# Patient Record
Sex: Female | Born: 1965 | Race: White | Hispanic: No | Smoking: Former smoker
Health system: Southern US, Community
[De-identification: ages and names within clinical notes are randomized; demographics above are authoritative.]

## PROBLEM LIST (undated history)

## (undated) DIAGNOSIS — D66 Hereditary factor VIII deficiency: Secondary | ICD-10-CM

## (undated) DIAGNOSIS — F32A Depression, unspecified: Secondary | ICD-10-CM

## (undated) DIAGNOSIS — F1010 Alcohol abuse, uncomplicated: Secondary | ICD-10-CM

## (undated) DIAGNOSIS — F329 Major depressive disorder, single episode, unspecified: Secondary | ICD-10-CM

---
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
## 1998-03-01 ENCOUNTER — Ambulatory Visit (HOSPITAL_COMMUNITY): Admission: RE | Admit: 1998-03-01 | Discharge: 1998-03-01 | Payer: Self-pay | Admitting: Family Medicine

## 1998-07-22 ENCOUNTER — Encounter: Payer: Self-pay | Admitting: Emergency Medicine

## 1998-07-22 ENCOUNTER — Emergency Department (HOSPITAL_COMMUNITY): Admission: EM | Admit: 1998-07-22 | Discharge: 1998-07-22 | Payer: Self-pay | Admitting: Emergency Medicine

## 1999-04-11 ENCOUNTER — Other Ambulatory Visit: Admission: RE | Admit: 1999-04-11 | Discharge: 1999-04-11 | Payer: Self-pay | Admitting: *Deleted

## 2000-09-03 ENCOUNTER — Other Ambulatory Visit: Admission: RE | Admit: 2000-09-03 | Discharge: 2000-09-03 | Payer: Self-pay | Admitting: *Deleted

## 2002-01-19 ENCOUNTER — Encounter: Admission: RE | Admit: 2002-01-19 | Discharge: 2002-04-19 | Payer: Self-pay | Admitting: Family Medicine

## 2003-08-13 ENCOUNTER — Emergency Department (HOSPITAL_COMMUNITY): Admission: EM | Admit: 2003-08-13 | Discharge: 2003-08-13 | Payer: Self-pay | Admitting: Emergency Medicine

## 2003-12-19 ENCOUNTER — Emergency Department (HOSPITAL_COMMUNITY): Admission: EM | Admit: 2003-12-19 | Discharge: 2003-12-19 | Payer: Self-pay | Admitting: Emergency Medicine

## 2004-01-11 ENCOUNTER — Other Ambulatory Visit: Admission: RE | Admit: 2004-01-11 | Discharge: 2004-01-11 | Payer: Self-pay | Admitting: Gynecology

## 2005-12-28 ENCOUNTER — Emergency Department (HOSPITAL_COMMUNITY): Admission: EM | Admit: 2005-12-28 | Discharge: 2005-12-28 | Payer: Self-pay | Admitting: Emergency Medicine

## 2008-01-29 ENCOUNTER — Emergency Department (HOSPITAL_COMMUNITY): Admission: EM | Admit: 2008-01-29 | Discharge: 2008-01-30 | Payer: Self-pay | Admitting: Emergency Medicine

## 2008-02-25 ENCOUNTER — Other Ambulatory Visit (HOSPITAL_COMMUNITY): Admission: RE | Admit: 2008-02-25 | Discharge: 2008-04-19 | Payer: Self-pay | Admitting: Psychiatry

## 2008-02-26 ENCOUNTER — Ambulatory Visit: Payer: Self-pay | Admitting: Psychiatry

## 2010-07-10 LAB — URINE DRUGS OF ABUSE SCREEN W ALC, ROUTINE (REF LAB)
Barbiturate Quant, Ur: NEGATIVE
Barbiturate Quant, Ur: NEGATIVE
Barbiturate Quant, Ur: NEGATIVE
Benzodiazepines.: NEGATIVE
Benzodiazepines.: NEGATIVE
Benzodiazepines.: NEGATIVE
Ethyl Alcohol: <10 mg/dL (ref <10)
Ethyl Alcohol: <10 mg/dL (ref <10)
Ethyl Alcohol: <10 mg/dL (ref <10)
Marijuana Metabolite: NEGATIVE
Marijuana Metabolite: NEGATIVE
Marijuana Metabolite: NEGATIVE
Methadone: NEGATIVE
Phencyclidine (PCP): NEGATIVE
Phencyclidine (PCP): NEGATIVE
Phencyclidine (PCP): NEGATIVE
Propoxyphene: NEGATIVE
Propoxyphene: NEGATIVE
Propoxyphene: NEGATIVE

## 2010-12-26 LAB — POCT I-STAT, CHEM 8
BUN: <3 — ABNORMAL LOW
Calcium, Ion: 1.13
Creatinine, Ser: 0.8
Hemoglobin: 15.3 — ABNORMAL HIGH
TCO2: 24

## 2010-12-26 LAB — DIFFERENTIAL
Basophils Absolute: 0
Basophils Relative: 0
Eosinophils Absolute: 0
Eosinophils Relative: 0
Monocytes Absolute: 0.3
Monocytes Relative: 5
Neutro Abs: 3.5

## 2010-12-26 LAB — URINALYSIS, ROUTINE W REFLEX MICROSCOPIC
Bilirubin Urine: NEGATIVE
Hgb urine dipstick: NEGATIVE
Ketones, ur: NEGATIVE
Nitrite: NEGATIVE
Specific Gravity, Urine: 1.007
Urobilinogen, UA: 0.2

## 2010-12-26 LAB — RAPID URINE DRUG SCREEN, HOSP PERFORMED
Amphetamines: NOT DETECTED
Barbiturates: NOT DETECTED
Benzodiazepines: NOT DETECTED
Tetrahydrocannabinol: NOT DETECTED

## 2010-12-26 LAB — CBC
HCT: 43.6
Hemoglobin: 14.8
MCHC: 34
MCV: 102.5 — ABNORMAL HIGH
RBC: 4.26
RDW: 12.8

## 2010-12-26 LAB — SALICYLATE LEVEL: Salicylate Lvl: <4

## 2010-12-29 LAB — URINE DRUGS OF ABUSE SCREEN W ALC, ROUTINE (REF LAB)
Amphetamine Screen, Ur: NEGATIVE
Benzodiazepines.: NEGATIVE
Benzodiazepines.: NEGATIVE
Benzodiazepines.: NEGATIVE
Cocaine Metabolites: NEGATIVE
Cocaine Metabolites: NEGATIVE
Cocaine Metabolites: NEGATIVE
Cocaine Metabolites: NEGATIVE
Creatinine,U: 16.1 mg/dL
Methadone: NEGATIVE
Methadone: NEGATIVE
Methadone: NEGATIVE
Methadone: NEGATIVE
Opiate Screen, Urine: NEGATIVE
Phencyclidine (PCP): NEGATIVE
Phencyclidine (PCP): NEGATIVE
Phencyclidine (PCP): NEGATIVE
Phencyclidine (PCP): NEGATIVE
Propoxyphene: NEGATIVE
Propoxyphene: NEGATIVE
Propoxyphene: NEGATIVE
Propoxyphene: NEGATIVE

## 2017-04-20 ENCOUNTER — Emergency Department (HOSPITAL_COMMUNITY)
Admission: EM | Admit: 2017-04-20 | Discharge: 2017-04-20 | Disposition: A | Discharge status: Home / Self Care | Payer: Self-pay | Attending: Emergency Medicine | Admitting: Emergency Medicine

## 2017-04-20 ENCOUNTER — Encounter (HOSPITAL_COMMUNITY): Payer: Self-pay | Admitting: Emergency Medicine

## 2017-04-20 DIAGNOSIS — D68311 Acquired hemophilia: Secondary | ICD-10-CM

## 2017-04-20 DIAGNOSIS — Y929 Unspecified place or not applicable: Secondary | ICD-10-CM

## 2017-04-20 DIAGNOSIS — Y939 Activity, unspecified: Secondary | ICD-10-CM

## 2017-04-20 DIAGNOSIS — W01198A Fall on same level from slipping, tripping and stumbling with subsequent striking against other object, initial encounter: Secondary | ICD-10-CM

## 2017-04-20 DIAGNOSIS — Y999 Unspecified external cause status: Secondary | ICD-10-CM

## 2017-04-20 DIAGNOSIS — S0181XA Laceration without foreign body of other part of head, initial encounter: Principal | ICD-10-CM

## 2017-04-20 HISTORY — DX: Hereditary factor VIII deficiency: D66

## 2017-04-20 MED ORDER — LIDOCAINE HCL (PF) 1 % IJ SOLN
INTRAMUSCULAR | Status: AC
  Filled 2017-04-20: qty 5

## 2017-04-20 NOTE — ED Provider Notes (Signed)
[facility]   CSN: [id number] Arrival date & time: 04/20/17  0157     History   Chief Complaint Chief Complaint  Patient presents with  . Fall  . Alcohol Intoxication    HPI Terri Maxwell is a 52 y.o. female.  Patient is a 52 year old female with past medical history of hemophilia presenting for evaluation of a head injury.  She reports falling at home this evening while intoxicated.  She struck her head on the floor and caused a laceration to the left forehead.  I am told there may have been a loss of consciousness, however she denies this.  She describes pain in the area of the laceration, but denies headache.  She denies any neck pain or other injury.   The history is provided by the patient.  Head Laceration  This is a new problem. The current episode started less than 1 hour ago. The problem occurs constantly. The problem has not changed since onset.Pertinent negatives include no headaches. Nothing aggravates the symptoms. Nothing relieves the symptoms. She has tried nothing for the symptoms.    Past Medical History:  Diagnosis Date  . Hemophilia (HCC)     There are no active problems to display for this patient.   History reviewed. No pertinent surgical history.  OB History    No data available       Home Medications    Prior to Admission medications   Not on File    Family History No family history on file.  Social History Social History   Tobacco Use  . Smoking status: Not on file  Substance Use Topics  . Alcohol use: Not on file  . Drug use: Not on file     Allergies   Codeine; Keflex [cephalexin]; Penicillins; and Plavix [clopidogrel bisulfate]   Review of Systems Review of Systems  Neurological: Negative for headaches.  All other systems reviewed and are negative.    Physical Exam Updated Vital Signs There were no vitals taken for this visit.  Physical Exam    Constitutional: She is oriented to person, place, and time. She appears well-developed and well-nourished. No distress.  HENT:  Head: Normocephalic.  There is a 3.5 cm laceration to the left forehead near the hairline.  Eyes: EOM are normal. Pupils are equal, round, and reactive to light.  Neck: Normal range of motion. Neck supple.  Pulmonary/Chest: Effort normal.  Neurological: She is alert and oriented to person, place, and time. No cranial nerve deficit. She exhibits normal muscle tone. Coordination normal.  Skin: She is not diaphoretic.  Nursing note and vitals reviewed.    ED Treatments / Results  Labs (all labs ordered are listed, but only abnormal results are displayed) Labs Reviewed - No data to display  EKG  EKG Interpretation None       Radiology No results found.  Procedures Procedures (including critical care time)  Medications Ordered in ED Medications  lidocaine (PF) (XYLOCAINE) 1 % injection (not administered)     Initial Impression / Assessment and Plan / ED Course  I have reviewed the triage vital signs and the nursing notes.  Pertinent labs & imaging results that were available during my care of the patient were reviewed by me and considered in my medical decision making (see chart for details).  Laceration was repaired as described below.  I strongly urged the patient to undergo a CT scan, however she adamantly refused.  She understands that her  history of "asymptomatic hemophilia" could potentially predispose her to potentially life-threatening bleeding and is still requesting to forego this test.  She will be discharged with local wound care and suture removal in 5-7 days.  LACERATION REPAIR Performed by: Geoffery Lyons Authorized by: Geoffery Lyons Consent: Verbal consent obtained. Risks and benefits: risks, benefits and alternatives were discussed Consent given by: patient Patient identity confirmed: provided demographic data Prepped and Draped  in normal sterile fashion Wound explored  Laceration Location: Left forehead  Laceration Length: 2.5 cm  No Foreign Bodies seen or palpated  Anesthesia: local infiltration  Local anesthetic: lidocaine 1 % with epinephrine  Anesthetic total: 3 ml  Irrigation method: syringe Amount of cleaning: standard  Skin closure: 5-0 Ethilon  Number of sutures: 6  Technique: Simple interrupted  Patient tolerance: Patient tolerated the procedure well with no immediate complications.   Final Clinical Impressions(s) / ED Diagnoses   Final diagnoses:  None    ED Discharge Orders    None       Geoffery Lyons, MD 04/20/17 [phone]

## 2017-04-20 NOTE — ED Notes (Signed)
Pt's significant other took pt home.  Wheelchair to front door and pt placed into his car. Asked pt if she felt she could go home and reiterated for her to tell us if she did not feel safe. Pt states she feels safe and wants to go with her boyfriend home.

## 2017-04-20 NOTE — ED Notes (Signed)
Pt declined vital signs during her entire stay in the emergency department.
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
## 2017-04-20 NOTE — Discharge Instructions (Signed)
Local wound care with bacitracin and dressing changes twice daily.  Sutures are to be removed in 5-7 days.  Please follow-up with your primary doctor for this.  Return to the emergency department in the meantime if you develop redness, pus draining from the wound, fevers, or other new and concerning symptoms.
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
## 2019-01-12 ENCOUNTER — Other Ambulatory Visit: Payer: Self-pay

## 2019-01-12 ENCOUNTER — Emergency Department (HOSPITAL_COMMUNITY): Payer: Self-pay

## 2019-01-12 ENCOUNTER — Emergency Department (HOSPITAL_COMMUNITY)
Admission: EM | Admit: 2019-01-12 | Discharge: 2019-01-12 | Discharge status: Against Medical Advice | Payer: Self-pay | Attending: Emergency Medicine | Admitting: Emergency Medicine

## 2019-01-12 ENCOUNTER — Encounter (HOSPITAL_COMMUNITY): Payer: Self-pay

## 2019-01-12 DIAGNOSIS — Z5320 Procedure and treatment not carried out because of patient's decision for unspecified reasons: Secondary | ICD-10-CM | POA: Insufficient documentation

## 2019-01-12 DIAGNOSIS — R4182 Altered mental status, unspecified: Secondary | ICD-10-CM | POA: Insufficient documentation

## 2019-01-12 HISTORY — DX: Depression, unspecified: F32.A

## 2019-01-12 LAB — COMPREHENSIVE METABOLIC PANEL
ALT: 84 U/L — ABNORMAL HIGH (ref 0–44)
AST: 91 U/L — ABNORMAL HIGH (ref 15–41)
Albumin: 4.5 g/dL (ref 3.5–5.0)
Alkaline Phosphatase: 81 U/L (ref 38–126)
Anion gap: 12 (ref 5–15)
BUN: 12 mg/dL (ref 6–20)
CO2: 23 mmol/L (ref 22–32)
Calcium: 9.2 mg/dL (ref 8.9–10.3)
Chloride: 104 mmol/L (ref 98–111)
Creatinine, Ser: 0.69 mg/dL (ref 0.44–1.00)
GFR calc Af Amer: >60 mL/min (ref >60)
GFR calc non Af Amer: >60 mL/min (ref >60)
Glucose, Bld: 107 mg/dL — ABNORMAL HIGH (ref 70–99)
Potassium: 3.9 mmol/L (ref 3.5–5.1)
Sodium: 139 mmol/L (ref 135–145)
Total Bilirubin: 1 mg/dL (ref 0.3–1.2)
Total Protein: 7.7 g/dL (ref 6.5–8.1)

## 2019-01-12 LAB — URINALYSIS, ROUTINE W REFLEX MICROSCOPIC
Bilirubin Urine: NEGATIVE
Glucose, UA: NEGATIVE mg/dL
Hgb urine dipstick: NEGATIVE
Ketones, ur: NEGATIVE mg/dL
Leukocytes,Ua: NEGATIVE
Nitrite: NEGATIVE
Protein, ur: NEGATIVE mg/dL
Specific Gravity, Urine: 1.015 (ref 1.005–1.030)
pH: 8 (ref 5.0–8.0)

## 2019-01-12 LAB — CBC WITH DIFFERENTIAL/PLATELET
Abs Immature Granulocytes: 0.04 10*3/uL (ref 0.00–0.07)
Basophils Absolute: 0.1 10*3/uL (ref 0.0–0.1)
Basophils Relative: 1 %
Eosinophils Absolute: 0 10*3/uL (ref 0.0–0.5)
Eosinophils Relative: 0 %
HCT: 42.6 % (ref 36.0–46.0)
Hemoglobin: 14.3 g/dL (ref 12.0–15.0)
Immature Granulocytes: 0 %
Lymphocytes Relative: 15 %
Lymphs Abs: 1.4 10*3/uL (ref 0.7–4.0)
MCH: 36.5 pg — ABNORMAL HIGH (ref 26.0–34.0)
MCHC: 33.6 g/dL (ref 30.0–36.0)
MCV: 108.7 fL — ABNORMAL HIGH (ref 80.0–100.0)
Monocytes Absolute: 0.9 10*3/uL (ref 0.1–1.0)
Monocytes Relative: 9 %
Neutro Abs: 6.8 10*3/uL (ref 1.7–7.7)
Neutrophils Relative %: 75 %
Platelets: 188 10*3/uL (ref 150–400)
RBC: 3.92 MIL/uL (ref 3.87–5.11)
RDW: 12.8 % (ref 11.5–15.5)
WBC: 9.3 10*3/uL (ref 4.0–10.5)
nRBC: 0 % (ref 0.0–0.2)

## 2019-01-12 LAB — ETHANOL: Alcohol, Ethyl (B): <10 mg/dL (ref <10)

## 2019-01-12 LAB — AMMONIA: Ammonia: 61 umol/L — ABNORMAL HIGH (ref 9–35)

## 2019-01-12 LAB — RAPID URINE DRUG SCREEN, HOSP PERFORMED
Amphetamines: NOT DETECTED
Barbiturates: NOT DETECTED
Benzodiazepines: NOT DETECTED
Cocaine: NOT DETECTED
Opiates: NOT DETECTED
Tetrahydrocannabinol: NOT DETECTED

## 2019-01-12 LAB — ACETAMINOPHEN LEVEL: Acetaminophen (Tylenol), Serum: <10 ug/mL — ABNORMAL LOW (ref 10–30)

## 2019-01-12 LAB — CBG MONITORING, ED: Glucose-Capillary: 106 mg/dL — ABNORMAL HIGH (ref 70–99)

## 2019-01-12 LAB — SALICYLATE LEVEL: Salicylate Lvl: <7 mg/dL (ref 2.8–30.0)
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
## 2019-01-12 NOTE — ED Notes (Signed)
Patient transported to CT 

## 2019-01-12 NOTE — ED Notes (Signed)
Pt got up out of the bed because she had to use the bathroom.  Walked her to the bathroom and gave her a specimen cup to urinate in.  Specimen cup is no where to be seen and pt urinated on the floor of the bathroom.

## 2019-01-12 NOTE — ED Triage Notes (Signed)
Pt BIB EMS from home. Pt daughter reports pt is disoriented and drowsy. Pt daughter reports this is not baseline. Pt denies taking any meds not prescribed. Pt prescribed Ambien and daughter reports full bottle of 30 tablets 4 days ago and now bottle is empty.   220/110 BP 111 HR  CBG 126  20G L hand

## 2019-01-12 NOTE — ED Notes (Signed)
ED Provider at bedside. 

## 2019-01-12 NOTE — ED Notes (Signed)
RN informed by Bonnita Nasuti, EMT that patient and patient daughter left. Pt has eloped. Dr. Melina Copa aware.

## 2020-05-18 IMAGING — CT CT HEAD W/O CM
3 series · 16 of 47 positions shown, 19 images · non-contrast
Comparison: None.

CLINICAL DATA: Altered LOC

EXAM:
CT HEAD WITHOUT CONTRAST
TECHNIQUE: Contiguous axial images were obtained from the base of the skull
through the vertex without intravenous contrast.

[Series 3: head wo · axial · 0.45mm/px · z∈[-188,-53]mm · 10 of 33 slices shown, 13 images]
[im 3/33  brain]
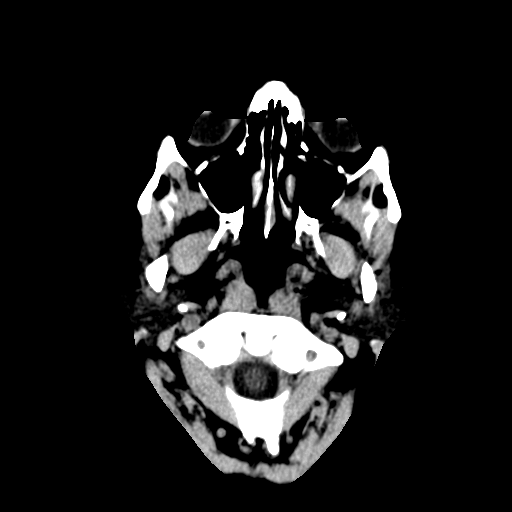
[im 3/33  bone]
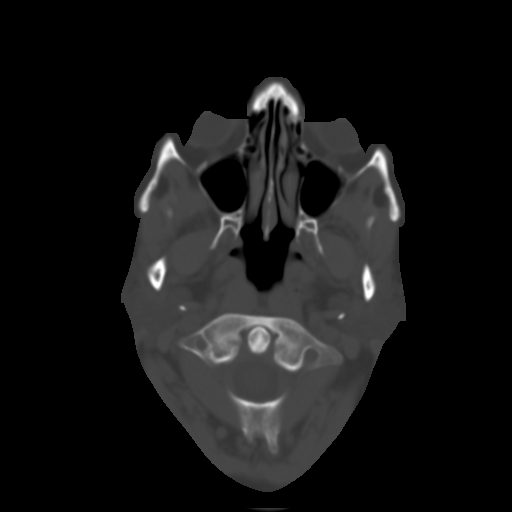
[im 6/33  brain]
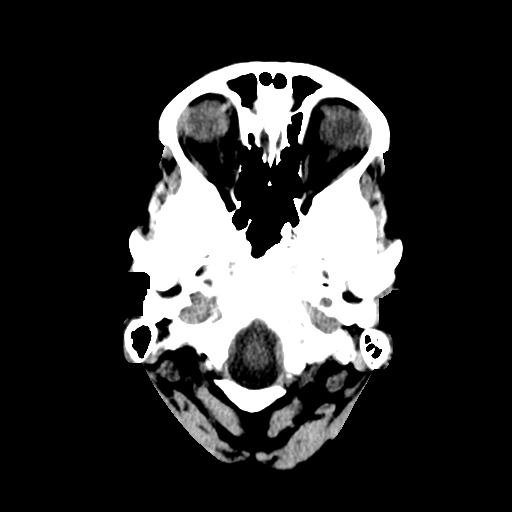
[im 9/33  brain]
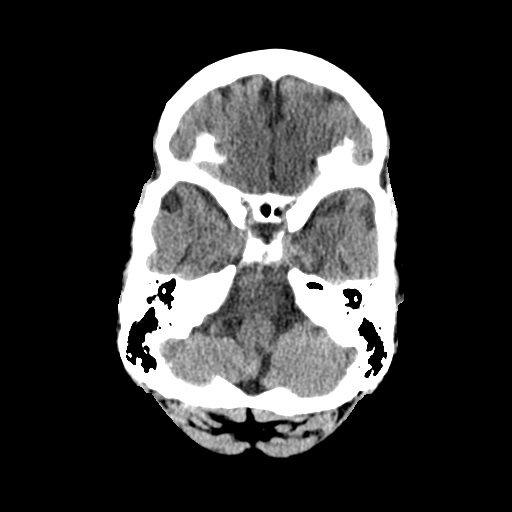
[im 12/33  brain]
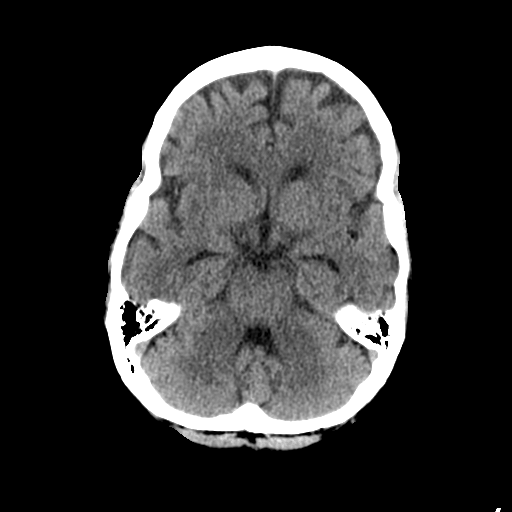
[im 15/33  brain]
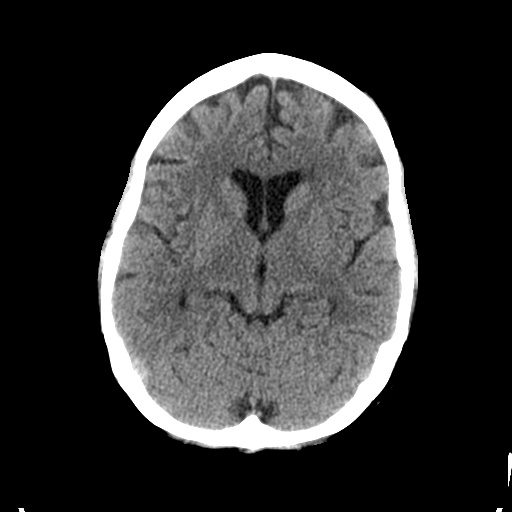
[im 15/33  bone]
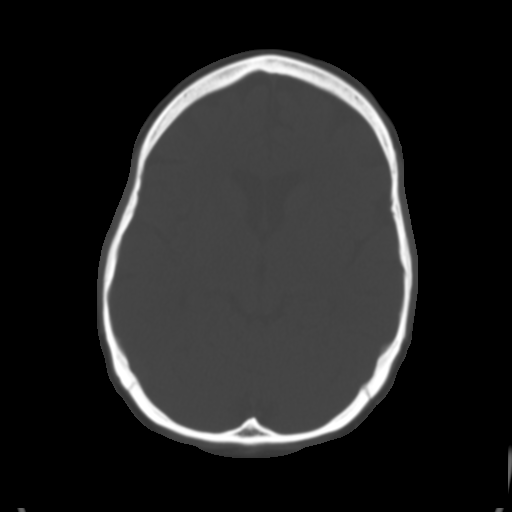
[im 18/33  brain]
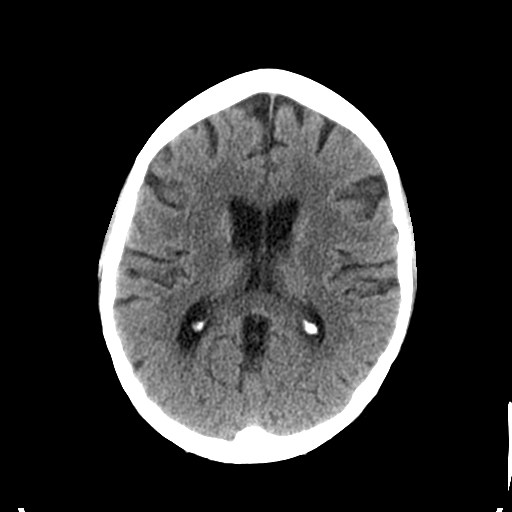
[im 21/33  brain]
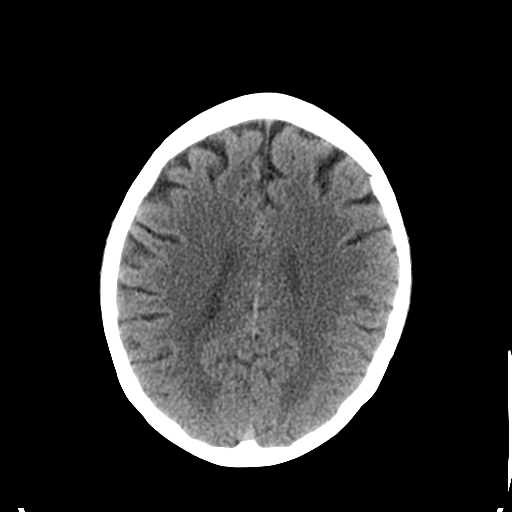
[im 25/33  brain]
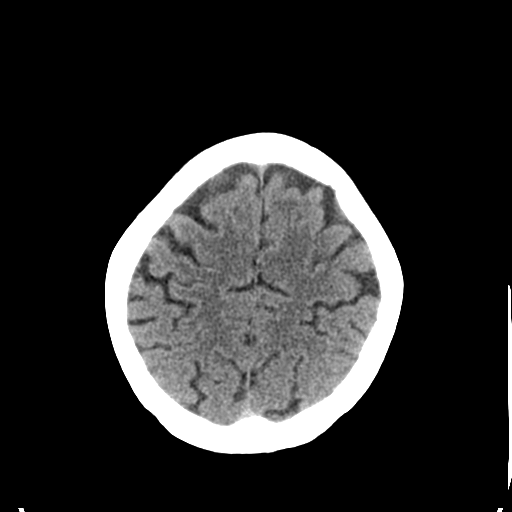
[im 27/33  brain]
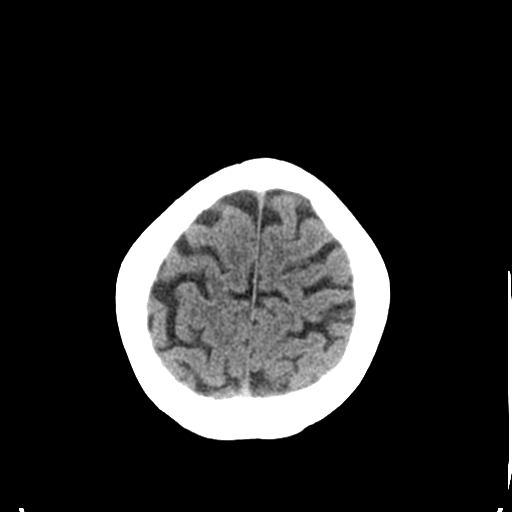
[im 27/33  bone]
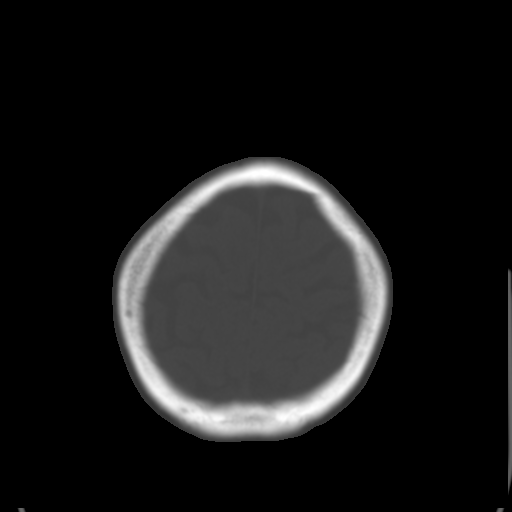
[im 30/33  brain]
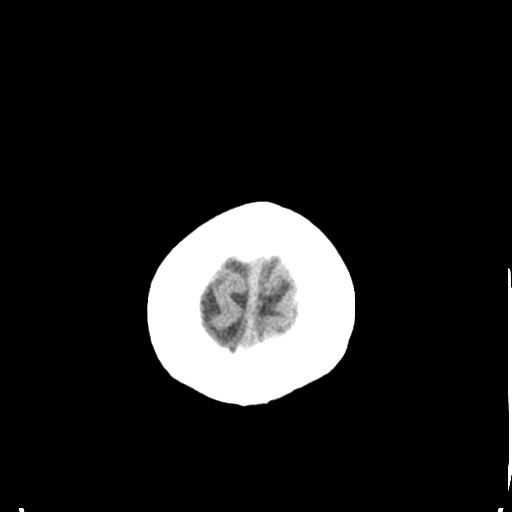

[Series 5: coronal soft tissue · coronal · 0.34mm/px · 3 of 83 slices shown]
[im 28/83  brain]
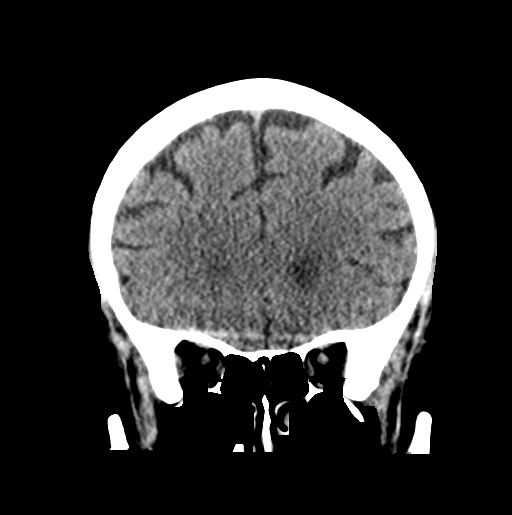
[im 37/83  brain]
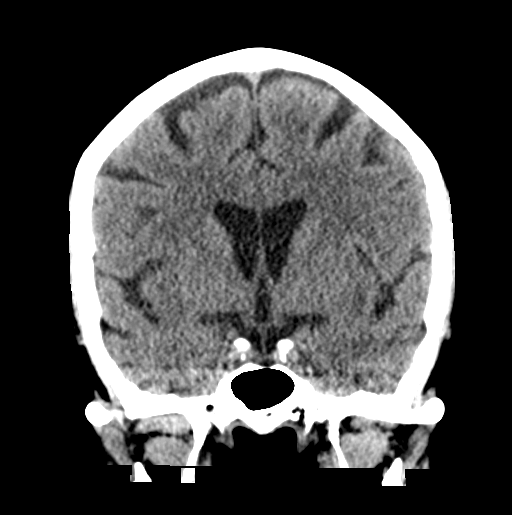
[im 46/83  brain]
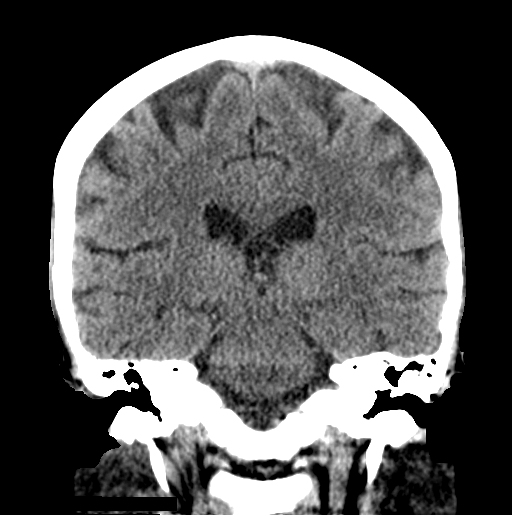

[Series 6: sagittal soft tissue · sagittal · 0.36mm/px · 3 of 55 slices shown]
[im 19/55  brain]
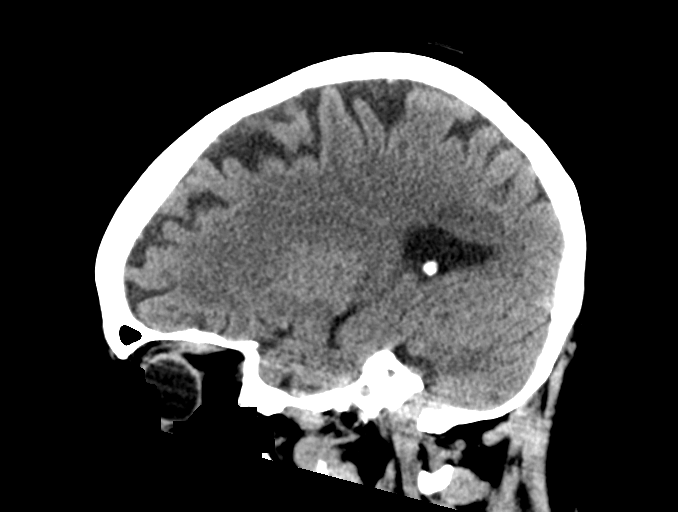
[im 28/55  brain]
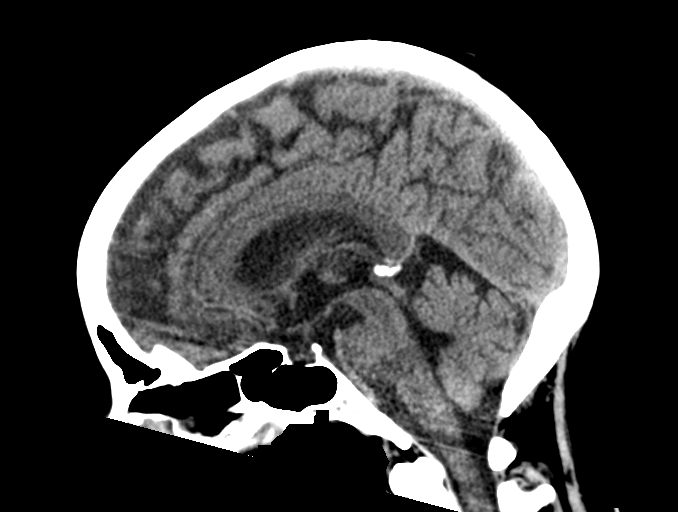
[im 37/55  brain]
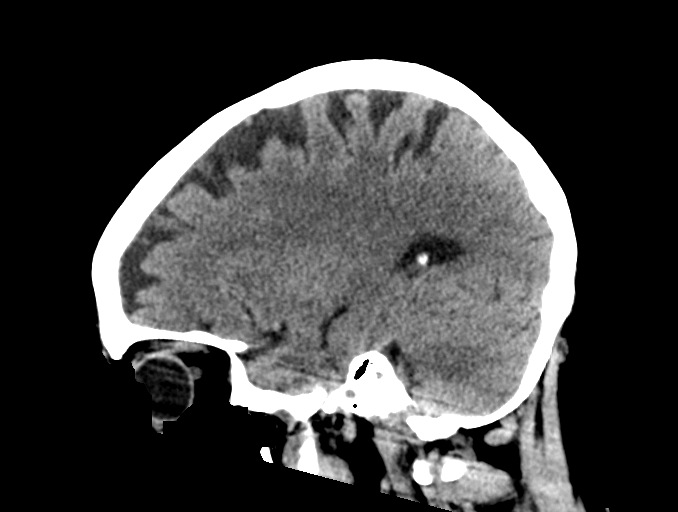

[16 of 47 positions shown; findings below may reference images not displayed]

FINDINGS: Brain: No evidence of acute infarction, hemorrhage, hydrocephalus,
extra-axial collection or mass lesion/mass effect.

Vascular: No hyperdense vessel or unexpected calcification.

Skull: Normal. Negative for fracture or focal lesion.

Sinuses/Orbits: No acute finding.

Other: None
IMPRESSION: Negative non contrasted CT appearance of the brain

## 2020-11-23 ENCOUNTER — Emergency Department (HOSPITAL_COMMUNITY)
Admission: EM | Admit: 2020-11-23 | Discharge: 2020-11-23 | Disposition: A | Discharge status: Home / Self Care | Payer: BC Managed Care – PPO | Attending: Emergency Medicine | Admitting: Emergency Medicine

## 2020-11-23 ENCOUNTER — Encounter (HOSPITAL_COMMUNITY): Payer: Self-pay | Admitting: *Deleted

## 2020-11-23 ENCOUNTER — Other Ambulatory Visit: Payer: Self-pay

## 2020-11-23 DIAGNOSIS — F1290 Cannabis use, unspecified, uncomplicated: Secondary | ICD-10-CM | POA: Insufficient documentation

## 2020-11-23 DIAGNOSIS — R112 Nausea with vomiting, unspecified: Secondary | ICD-10-CM | POA: Insufficient documentation

## 2020-11-23 DIAGNOSIS — R509 Fever, unspecified: Secondary | ICD-10-CM | POA: Diagnosis not present

## 2020-11-23 DIAGNOSIS — Z79899 Other long term (current) drug therapy: Secondary | ICD-10-CM | POA: Insufficient documentation

## 2020-11-23 DIAGNOSIS — R000 Tachycardia, unspecified: Secondary | ICD-10-CM | POA: Diagnosis not present

## 2020-11-23 DIAGNOSIS — R197 Diarrhea, unspecified: Secondary | ICD-10-CM | POA: Insufficient documentation

## 2020-11-23 LAB — COMPREHENSIVE METABOLIC PANEL
ALT: 41 U/L (ref 0–44)
AST: 46 U/L — ABNORMAL HIGH (ref 15–41)
Albumin: 5.2 g/dL — ABNORMAL HIGH (ref 3.5–5.0)
Alkaline Phosphatase: 121 U/L (ref 38–126)
Anion gap: 18 — ABNORMAL HIGH (ref 5–15)
BUN: 18 mg/dL (ref 6–20)
CO2: 20 mmol/L — ABNORMAL LOW (ref 22–32)
Calcium: 11 mg/dL — ABNORMAL HIGH (ref 8.9–10.3)
Chloride: 99 mmol/L (ref 98–111)
Creatinine, Ser: 0.8 mg/dL (ref 0.44–1.00)
GFR, Estimated: >60 mL/min (ref >60)
Glucose, Bld: 145 mg/dL — ABNORMAL HIGH (ref 70–99)
Potassium: 3.8 mmol/L (ref 3.5–5.1)
Sodium: 137 mmol/L (ref 135–145)
Total Bilirubin: 1.8 mg/dL — ABNORMAL HIGH (ref 0.3–1.2)
Total Protein: 9.4 g/dL — ABNORMAL HIGH (ref 6.5–8.1)

## 2020-11-23 LAB — URINALYSIS, ROUTINE W REFLEX MICROSCOPIC
Glucose, UA: NEGATIVE mg/dL
Hgb urine dipstick: NEGATIVE
Ketones, ur: 80 mg/dL — AB
Leukocytes,Ua: NEGATIVE
Nitrite: NEGATIVE
Protein, ur: 30 mg/dL — AB
Specific Gravity, Urine: 1.025 (ref 1.005–1.030)
pH: 6 (ref 5.0–8.0)

## 2020-11-23 LAB — RAPID URINE DRUG SCREEN, HOSP PERFORMED
Amphetamines: POSITIVE — AB
Barbiturates: NOT DETECTED
Benzodiazepines: NOT DETECTED
Cocaine: NOT DETECTED
Opiates: NOT DETECTED
Tetrahydrocannabinol: POSITIVE — AB

## 2020-11-23 LAB — CBC
HCT: 52.4 % — ABNORMAL HIGH (ref 36.0–46.0)
Hemoglobin: 18.3 g/dL — ABNORMAL HIGH (ref 12.0–15.0)
MCH: 36.8 pg — ABNORMAL HIGH (ref 26.0–34.0)
MCHC: 34.9 g/dL (ref 30.0–36.0)
MCV: 105.4 fL — ABNORMAL HIGH (ref 80.0–100.0)
Platelets: 286 10*3/uL (ref 150–400)
RBC: 4.97 MIL/uL (ref 3.87–5.11)
RDW: 12.4 % (ref 11.5–15.5)
WBC: 12 10*3/uL — ABNORMAL HIGH (ref 4.0–10.5)
nRBC: 0 % (ref 0.0–0.2)

## 2020-11-23 LAB — I-STAT BETA HCG BLOOD, ED (MC, WL, AP ONLY): I-stat hCG, quantitative: <5 m[IU]/mL (ref <5)

## 2020-11-23 LAB — LIPASE, BLOOD: Lipase: 50 U/L (ref 11–51)

## 2020-11-23 MED ORDER — PROMETHAZINE HCL 25 MG RE SUPP: 25.0000 mg | Freq: Four times a day (QID) | RECTAL | 0 refills | Status: DC | PRN

## 2020-11-23 MED ORDER — SODIUM CHLORIDE 0.9 % IV SOLN
12.5000 mg | Freq: Four times a day (QID) | INTRAVENOUS | Status: DC | PRN
  Administered 2020-11-23: 12.5 mg via INTRAVENOUS
  Filled 2020-11-23: qty 12.5

## 2020-11-23 MED ORDER — SODIUM CHLORIDE 0.9 % IV BOLUS
1000.0000 mL | Freq: Once | INTRAVENOUS | Status: AC
  Administered 2020-11-23: 1000 mL via INTRAVENOUS

## 2020-11-23 NOTE — ED Provider Notes (Signed)
Emergency Medicine Provider Triage Evaluation Note  Terri Maxwell , a 55 y.o. female  was evaluated in triage.  Pt complains of nausea, vomiting for the past 3 days.  Reports initially had abdominal pain however this has now resolved.  Has unable to keep anything down.  Previous episodes of gastroenteritis similar to these, where she had to receive Phenergan suppositories.  She is without any abdominal pain or chest pain on today's visit.  Review of Systems  Positive: Nausea, vomiting Negative: Fever, abdominal, urinary symptoms, chest pain  Physical Exam  BP (!) 176/128 (BP Location: Left Arm)   Pulse (!) 132   Temp 97.8 F (36.6 C) (Axillary) Comment: ice in mouth  Resp 20   SpO2 98%  Gen:   Awake, no distress   Resp:  Normal effort  MSK:   Moves extremities without difficulty  Other:    Medical Decision Making  Medically screening exam initiated at 10:42 AM.  Appropriate orders placed.  [name] was informed that the remainder of the evaluation will be completed by another provider, this initial triage assessment does not replace that evaluation, and the importance of remaining in the ED until their evaluation is complete.  Patient here with nausea and vomiting similar episodes in the past consistent with gastroenteritis per patient.  Has not taken anything for pain.  Heart rate remarkable for tachycardia.  She is without any abdominal pain or chest pain.  Afebrile in triage.   Claude Manges, PA-C 11/23/20 1044    Ernie Avena, MD 11/23/20 1115

## 2020-11-23 NOTE — ED Triage Notes (Signed)
Pt complains of fever, emesis x 3 days. Initially had abd pain and diarrhea, which has since resolved.

## 2020-11-23 NOTE — Discharge Instructions (Addendum)
You were provided with a prescription for phenergan to help with your symptoms. Please take this medication as prescribed.   If you experience any worsening symptoms please return to the ED for reevaluation.

## 2020-11-23 NOTE — ED Notes (Signed)
Informed the nurse of b/p

## 2020-11-23 NOTE — ED Provider Notes (Signed)
[facility]-EMERGENCY DEPT Provider Note   CSN: [id number] Arrival date & time: 11/23/20  1023     History Chief Complaint  Patient presents with   Emesis   Fever    Terri Maxwell is a 55 y.o. female.  54 y.o female with a PMH of Depression, presents to the ED with a chief complaint of nausea, vomiting which began 3 days ago.  Patient reports she had a previous episode of "GI bug ", this has occurred in the past.  States he has been unable to keep anything down, has tried ice chips, water without any improvement in her symptoms.  She reports approximately 15 episodes of vomiting in the past 3 days daily.  Also reports a subjective fever that improved after Tuesday morning.  Is currently employed at [employer], reports no other sick exposures.  States multiple episodes of bilious emesis.  No hematemesis.  Prior history of alcohol use in the past.  Denies any abdominal pain, chest pain, urinary symptoms, other complaints.   The history is provided by the patient and medical records.  Emesis Severity:  Moderate Duration:  3 days Timing:  Constant Number of daily episodes:  15 Quality:  Bilious material Progression:  Worsening Chronicity:  Recurrent Recent urination:  Normal Relieved by:  Nothing Worsened by:  Nothing Ineffective treatments:  Antiemetics Associated symptoms: diarrhea and fever   Associated symptoms: no abdominal pain   Fever Associated symptoms: diarrhea and vomiting   Associated symptoms: no chest pain       Past Medical History:  Diagnosis Date   Depression    Hemophilia (HCC)     There are no problems to display for this patient.   History reviewed. No pertinent surgical history.   OB History   No obstetric history on file.     No family history on file.     Home Medications Prior to Admission medications   Medication Sig Start Date End Date Taking? Authorizing Provider  Biotin 10 MG TABS Take 1 tablet by mouth  daily.   Yes [provider]  Multiple Vitamins-Minerals (MULTIVITAMIN ADULTS PO) Take 1 tablet by mouth daily.   Yes [provider]  promethazine (PHENERGAN) 25 MG suppository Place 1 suppository (25 mg total) rectally every 6 (six) hours as needed for up to 5 days for nausea or vomiting. 11/23/20 11/28/20 Yes Johncarlos Holtsclaw, Leonie Douglas, PA-C    Allergies    Codeine, Keflex [cephalexin], and Penicillins  Review of Systems   Review of Systems  Constitutional:  Positive for fever.  Respiratory:  Negative for shortness of breath.   Cardiovascular:  Negative for chest pain.  Gastrointestinal:  Positive for diarrhea and vomiting. Negative for abdominal pain.  Genitourinary:  Negative for flank pain.  Musculoskeletal:  Negative for back pain.  Skin:  Negative for pallor and wound.  Neurological:  Negative for light-headedness and numbness.  All other systems reviewed and are negative.  Physical Exam Updated Vital Signs BP (!) 167/102 (BP Location: Left Arm)   Pulse 72   Temp 97.8 F (36.6 C) (Axillary) Comment: ice in mouth  Resp 17   SpO2 94%   Physical Exam Vitals and nursing note reviewed.  Constitutional:      General: She is not in acute distress.    Appearance: She is well-developed.  HENT:     Head: Normocephalic and atraumatic.     Mouth/Throat:     Pharynx: No oropharyngeal exudate.  Eyes:     Pupils:  Pupils are equal, round, and reactive to light.  Cardiovascular:     Rate and Rhythm: Regular rhythm. Tachycardia present.     Heart sounds: Normal heart sounds.     Comments: Tachycardia on arrival and afebrile.  Pulmonary:     Effort: Pulmonary effort is normal. No respiratory distress.     Breath sounds: Normal breath sounds.  Abdominal:     General: Bowel sounds are normal. There is no distension.     Palpations: Abdomen is soft.     Tenderness: There is no abdominal tenderness.  Musculoskeletal:        General: No tenderness or deformity.     Cervical  back: Normal range of motion.     Right lower leg: No edema.     Left lower leg: No edema.  Skin:    General: Skin is warm and dry.  Neurological:     Mental Status: She is alert and oriented to person, place, and time.    ED Results / Procedures / Treatments   Labs (all labs ordered are listed, but only abnormal results are displayed) Labs Reviewed  COMPREHENSIVE METABOLIC PANEL - Abnormal; Notable for the following components:      Result Value   CO2 20 (*)    Glucose, Bld 145 (*)    Calcium 11.0 (*)    Total Protein 9.4 (*)    Albumin 5.2 (*)    AST 46 (*)    Total Bilirubin 1.8 (*)    Anion gap 18 (*)    All other components within normal limits  CBC - Abnormal; Notable for the following components:   WBC 12.0 (*)    Hemoglobin 18.3 (*)    HCT 52.4 (*)    MCV 105.4 (*)    MCH 36.8 (*)    All other components within normal limits  URINALYSIS, ROUTINE W REFLEX MICROSCOPIC - Abnormal; Notable for the following components:   Bilirubin Urine MODERATE (*)    Ketones, ur 80 (*)    Protein, ur 30 (*)    Bacteria, UA FEW (*)    All other components within normal limits  RAPID URINE DRUG SCREEN, HOSP PERFORMED - Abnormal; Notable for the following components:   Amphetamines POSITIVE (*)    Tetrahydrocannabinol POSITIVE (*)    All other components within normal limits  URINE CULTURE  LIPASE, BLOOD  I-STAT BETA HCG BLOOD, ED ([facility], [facility], [facility] ONLY)    EKG None  Radiology No results found.  Procedures Procedures   Medications Ordered in ED Medications  promethazine (PHENERGAN) 12.5 mg in sodium chloride 0.9 % 50 mL IVPB (12.5 mg Intravenous New Bag/Given 11/23/20 1152)  sodium chloride 0.9 % bolus 1,000 mL (0 mLs Intravenous Stopped 11/23/20 1302)  sodium chloride 0.9 % bolus 1,000 mL (1,000 mLs Intravenous Bolus 11/23/20 1302)    ED Course  I have reviewed the triage vital signs and the nursing notes.  Pertinent labs & imaging results that were available during my care  of the patient were reviewed by me and considered in my medical decision making (see chart for details).  Clinical Course as of 11/23/20 1459  Wed Nov 23, 2020  1216 WBC(!): 12.0 [JS]  1217 Creatinine: 0.80 [JS]  1217 Bacteria, UA(!): FEW [JS]  1217 WBC, UA: 11-20 [JS]  1454 Amphetamines(!): POSITIVE [JS]  1456 Tetrahydrocannabinol(!): POSITIVE [JS]    Clinical Course User Index [JS] Claude Manges, PA-C   MDM Rules/Calculators/A&P  Patient with a past medical history of  hemophilia presents to the ED with a chief complaint of nausea, vomiting for the past 3 days.  Patient does report similar symptoms in the past, with a previous GI bug.  She does report working at [employer], has not had any sick contacts at this time.  She is try some over-the-counter medication along with ice and chips without much improvement in her symptoms.  On today's arrival, she is tachycardic with a heart rate in the 130s, she is afebrile with an oral temp of 97.8.  Blood pressure slightly elevated, she was previously placed on blood pressure medication, however this was discontinued.  States she has not follow-up with her primary care physician as she "lost insurance coverage ".  On evaluation patient appears dry, tachycardic at baseline.  Abdomen is soft, nontender to palpation throughout.  There is no CVA tenderness, no decreased breath sounds noted.  Calf tenderness bilaterally.  Depression of her blood work reveal a CBC with a leukocytosis of 12, globin is slightly elevated at 15.3.  Lipase level is normal.  CMP without any electrolyte derangement, creatinine levels within normal limits despite multiple episodes of vomiting.  She does have a slight anion gap at 18.  UA with some few bacteria, white blood cells 11-20.  He does not have any urinary symptoms, aside from 1 episode of discomfort with urination over the weekend.  This was sent for culture at this time.  UDS is positive for amphetamines, THC.  She is  asymptomatic at this time.  Has not had more episodes of vomiting after receiving pregnant.  I did discuss with her obtaining a prescription for suppository Phenergan to have worked with her in the past.She has had elevated blood pressure while in the ED, does report a prior history of hypertension, however she is without any chest pain, shortness of breath, labs are within normal limits without any signs of endorgan damage.  We discussed following up with PCP in order to obtain medication to help her with her blood pressure.  She is agreeable to this at this time, patient stable for discharge.   Portions of this note were generated with Scientist, clinical (histocompatibility and immunogenetics). Dictation errors may occur despite best attempts at proofreading.  Final Clinical Impression(s) / ED Diagnoses Final diagnoses:  Nausea vomiting and diarrhea    Rx / DC Orders ED Discharge Orders          Ordered    promethazine (PHENERGAN) 25 MG suppository  Every 6 hours PRN        11/23/20 1456             Claude Manges, PA-C 11/23/20 1459    Ernie Avena, MD 11/23/20 1656

## 2020-11-24 LAB — URINE CULTURE: Culture: NO GROWTH

## 2021-09-02 ENCOUNTER — Encounter (HOSPITAL_COMMUNITY): Payer: Self-pay | Admitting: Emergency Medicine

## 2021-09-02 ENCOUNTER — Other Ambulatory Visit: Payer: Self-pay

## 2021-09-02 ENCOUNTER — Inpatient Hospital Stay (HOSPITAL_COMMUNITY)
Admission: EM | Admit: 2021-09-02 | Discharge: 2021-09-05 | DRG: 896 | Disposition: A | Discharge status: Home / Self Care | Payer: BC Managed Care – PPO | Attending: Internal Medicine | Admitting: Internal Medicine

## 2021-09-02 DIAGNOSIS — F32A Depression, unspecified: Secondary | ICD-10-CM

## 2021-09-02 DIAGNOSIS — Z885 Allergy status to narcotic agent status: Secondary | ICD-10-CM | POA: Diagnosis not present

## 2021-09-02 DIAGNOSIS — D7589 Other specified diseases of blood and blood-forming organs: Secondary | ICD-10-CM | POA: Diagnosis present

## 2021-09-02 DIAGNOSIS — G9341 Metabolic encephalopathy: Secondary | ICD-10-CM

## 2021-09-02 DIAGNOSIS — R739 Hyperglycemia, unspecified: Secondary | ICD-10-CM | POA: Diagnosis present

## 2021-09-02 DIAGNOSIS — F909 Attention-deficit hyperactivity disorder, unspecified type: Secondary | ICD-10-CM | POA: Diagnosis present

## 2021-09-02 DIAGNOSIS — I10 Essential (primary) hypertension: Secondary | ICD-10-CM

## 2021-09-02 DIAGNOSIS — F10139 Alcohol abuse with withdrawal, unspecified: Principal | ICD-10-CM | POA: Diagnosis present

## 2021-09-02 DIAGNOSIS — Y929 Unspecified place or not applicable: Secondary | ICD-10-CM

## 2021-09-02 DIAGNOSIS — F10939 Alcohol use, unspecified with withdrawal, unspecified: Secondary | ICD-10-CM | POA: Diagnosis present

## 2021-09-02 DIAGNOSIS — E876 Hypokalemia: Secondary | ICD-10-CM

## 2021-09-02 DIAGNOSIS — R441 Visual hallucinations: Secondary | ICD-10-CM | POA: Diagnosis present

## 2021-09-02 DIAGNOSIS — Z880 Allergy status to penicillin: Secondary | ICD-10-CM

## 2021-09-02 DIAGNOSIS — F10931 Alcohol use, unspecified with withdrawal delirium: Secondary | ICD-10-CM | POA: Diagnosis not present

## 2021-09-02 DIAGNOSIS — F1310 Sedative, hypnotic or anxiolytic abuse, uncomplicated: Secondary | ICD-10-CM | POA: Diagnosis present

## 2021-09-02 DIAGNOSIS — Z888 Allergy status to other drugs, medicaments and biological substances status: Secondary | ICD-10-CM

## 2021-09-02 DIAGNOSIS — G4700 Insomnia, unspecified: Secondary | ICD-10-CM | POA: Diagnosis present

## 2021-09-02 DIAGNOSIS — T426X1A Poisoning by other antiepileptic and sedative-hypnotic drugs, accidental (unintentional), initial encounter: Secondary | ICD-10-CM | POA: Diagnosis present

## 2021-09-02 DIAGNOSIS — F1010 Alcohol abuse, uncomplicated: Secondary | ICD-10-CM

## 2021-09-02 DIAGNOSIS — Y900 Blood alcohol level of less than 20 mg/100 ml: Secondary | ICD-10-CM | POA: Diagnosis present

## 2021-09-02 DIAGNOSIS — T50902D Poisoning by unspecified drugs, medicaments and biological substances, intentional self-harm, subsequent encounter: Secondary | ICD-10-CM | POA: Diagnosis not present

## 2021-09-02 DIAGNOSIS — Z79899 Other long term (current) drug therapy: Secondary | ICD-10-CM

## 2021-09-02 DIAGNOSIS — T50901A Poisoning by unspecified drugs, medicaments and biological substances, accidental (unintentional), initial encounter: Secondary | ICD-10-CM

## 2021-09-02 DIAGNOSIS — E860 Dehydration: Secondary | ICD-10-CM | POA: Diagnosis present

## 2021-09-02 DIAGNOSIS — E538 Deficiency of other specified B group vitamins: Secondary | ICD-10-CM | POA: Diagnosis present

## 2021-09-02 DIAGNOSIS — G928 Other toxic encephalopathy: Secondary | ICD-10-CM | POA: Diagnosis present

## 2021-09-02 HISTORY — DX: Alcohol abuse, uncomplicated: F10.10

## 2021-09-02 LAB — COMPREHENSIVE METABOLIC PANEL
ALT: 36 U/L (ref 0–44)
AST: 38 U/L (ref 15–41)
Albumin: 4.6 g/dL (ref 3.5–5.0)
Alkaline Phosphatase: 81 U/L (ref 38–126)
Anion gap: 11 (ref 5–15)
BUN: 9 mg/dL (ref 6–20)
CO2: 28 mmol/L (ref 22–32)
Calcium: 10.5 mg/dL — ABNORMAL HIGH (ref 8.9–10.3)
Chloride: 100 mmol/L (ref 98–111)
Creatinine, Ser: 0.72 mg/dL (ref 0.44–1.00)
GFR, Estimated: >60 mL/min (ref >60)
Glucose, Bld: 145 mg/dL — ABNORMAL HIGH (ref 70–99)
Potassium: 3.1 mmol/L — ABNORMAL LOW (ref 3.5–5.1)
Sodium: 139 mmol/L (ref 135–145)
Total Bilirubin: 1.3 mg/dL — ABNORMAL HIGH (ref 0.3–1.2)
Total Protein: 7.7 g/dL (ref 6.5–8.1)

## 2021-09-02 LAB — LACTIC ACID, PLASMA: Lactic Acid, Venous: 1.3 mmol/L (ref 0.5–1.9)

## 2021-09-02 LAB — RENAL FUNCTION PANEL
Albumin: 4.1 g/dL (ref 3.5–5.0)
Anion gap: 9 (ref 5–15)
BUN: 8 mg/dL (ref 6–20)
CO2: 25 mmol/L (ref 22–32)
Calcium: 9.4 mg/dL (ref 8.9–10.3)
Chloride: 102 mmol/L (ref 98–111)
Creatinine, Ser: 0.52 mg/dL (ref 0.44–1.00)
GFR, Estimated: >60 mL/min (ref >60)
Glucose, Bld: 124 mg/dL — ABNORMAL HIGH (ref 70–99)
Phosphorus: 5.1 mg/dL — ABNORMAL HIGH (ref 2.5–4.6)
Potassium: 2.9 mmol/L — ABNORMAL LOW (ref 3.5–5.1)
Sodium: 136 mmol/L (ref 135–145)

## 2021-09-02 LAB — CBC
HCT: 46.6 % — ABNORMAL HIGH (ref 36.0–46.0)
Hemoglobin: 16.4 g/dL — ABNORMAL HIGH (ref 12.0–15.0)
MCH: 36.6 pg — ABNORMAL HIGH (ref 26.0–34.0)
MCHC: 35.2 g/dL (ref 30.0–36.0)
MCV: 104 fL — ABNORMAL HIGH (ref 80.0–100.0)
Platelets: 210 10*3/uL (ref 150–400)
RBC: 4.48 MIL/uL (ref 3.87–5.11)
RDW: 12.6 % (ref 11.5–15.5)
WBC: 8.5 10*3/uL (ref 4.0–10.5)
nRBC: 0 % (ref 0.0–0.2)

## 2021-09-02 LAB — ETHANOL: Alcohol, Ethyl (B): <10 mg/dL (ref <10)

## 2021-09-02 LAB — HEMOGLOBIN A1C
Hgb A1c MFr Bld: 5.9 % — ABNORMAL HIGH (ref 4.8–5.6)
Mean Plasma Glucose: 122.63 mg/dL

## 2021-09-02 LAB — FOLATE: Folate: 11.8 ng/mL (ref >5.9)

## 2021-09-02 LAB — MAGNESIUM: Magnesium: 2 mg/dL (ref 1.7–2.4)

## 2021-09-02 LAB — SALICYLATE LEVEL: Salicylate Lvl: <7 mg/dL — ABNORMAL LOW (ref 7.0–30.0)

## 2021-09-02 LAB — HIV ANTIBODY (ROUTINE TESTING W REFLEX): HIV Screen 4th Generation wRfx: NONREACTIVE

## 2021-09-02 LAB — VITAMIN B12: Vitamin B-12: 161 pg/mL — ABNORMAL LOW (ref 180–914)

## 2021-09-02 LAB — PHOSPHORUS: Phosphorus: 4.8 mg/dL — ABNORMAL HIGH (ref 2.5–4.6)

## 2021-09-02 LAB — ACETAMINOPHEN LEVEL: Acetaminophen (Tylenol), Serum: <10 ug/mL — ABNORMAL LOW (ref 10–30)

## 2021-09-02 MED ORDER — ADULT MULTIVITAMIN W/MINERALS CH
1.0000 | ORAL_TABLET | Freq: Every day | ORAL | Status: DC
  Administered 2021-09-04 – 2021-09-05 (×2): 1 via ORAL
  Filled 2021-09-02 (×2): qty 1

## 2021-09-02 MED ORDER — SODIUM CHLORIDE 0.9 % IV BOLUS
1000.0000 mL | Freq: Once | INTRAVENOUS | Status: AC
  Administered 2021-09-02: 1000 mL via INTRAVENOUS

## 2021-09-02 MED ORDER — LORAZEPAM 2 MG/ML IJ SOLN
4.0000 mg | Freq: Once | INTRAMUSCULAR | Status: AC
Start: 2021-09-02 — End: 2021-09-02
  Administered 2021-09-02: 4 mg via INTRAMUSCULAR

## 2021-09-02 MED ORDER — LORAZEPAM 2 MG/ML IJ SOLN: 1.0000 mg | INTRAMUSCULAR | Status: DC | PRN

## 2021-09-02 MED ORDER — LORAZEPAM 2 MG/ML IJ SOLN: 0.0000 mg | Freq: Two times a day (BID) | INTRAMUSCULAR | Status: DC

## 2021-09-02 MED ORDER — STERILE WATER FOR INJECTION IJ SOLN
INTRAMUSCULAR | Status: AC
  Filled 2021-09-02: qty 10

## 2021-09-02 MED ORDER — LORAZEPAM 2 MG/ML IJ SOLN
1.0000 mg | INTRAMUSCULAR | Status: AC | PRN
  Administered 2021-09-02: 3 mg via INTRAVENOUS
  Administered 2021-09-02 – 2021-09-03 (×4): 2 mg via INTRAVENOUS
  Filled 2021-09-02 (×3): qty 1
  Filled 2021-09-02: qty 2
  Filled 2021-09-02: qty 1

## 2021-09-02 MED ORDER — HYDRALAZINE HCL 20 MG/ML IJ SOLN
10.0000 mg | Freq: Three times a day (TID) | INTRAMUSCULAR | Status: DC | PRN
  Administered 2021-09-03: 10 mg via INTRAVENOUS
  Filled 2021-09-02: qty 1

## 2021-09-02 MED ORDER — LORAZEPAM 2 MG/ML IJ SOLN
2.0000 mg | Freq: Once | INTRAMUSCULAR | Status: AC
  Administered 2021-09-02: 2 mg via INTRAVENOUS
  Filled 2021-09-02: qty 1

## 2021-09-02 MED ORDER — ZIPRASIDONE MESYLATE 20 MG IM SOLR
20.0000 mg | Freq: Once | INTRAMUSCULAR | Status: AC
  Administered 2021-09-02: 20 mg via INTRAMUSCULAR
  Filled 2021-09-02: qty 20

## 2021-09-02 MED ORDER — LORAZEPAM 2 MG/ML IJ SOLN
0.0000 mg | Freq: Four times a day (QID) | INTRAMUSCULAR | Status: AC
  Administered 2021-09-02 (×2): 2 mg via INTRAVENOUS
  Administered 2021-09-02: 3 mg via INTRAVENOUS
  Administered 2021-09-03: 2 mg via INTRAVENOUS
  Administered 2021-09-03: 3 mg via INTRAVENOUS
  Administered 2021-09-03: 1 mg via INTRAVENOUS
  Filled 2021-09-02: qty 2
  Filled 2021-09-02 (×5): qty 1
  Filled 2021-09-02: qty 2
  Filled 2021-09-02: qty 1

## 2021-09-02 MED ORDER — THIAMINE HCL 100 MG PO TABS
100.0000 mg | ORAL_TABLET | Freq: Every day | ORAL | Status: DC
  Administered 2021-09-04 – 2021-09-05 (×2): 100 mg via ORAL
  Filled 2021-09-02 (×2): qty 1

## 2021-09-02 MED ORDER — POTASSIUM CHLORIDE 10 MEQ/100ML IV SOLN
10.0000 meq | INTRAVENOUS | Status: AC
  Administered 2021-09-02 (×6): 10 meq via INTRAVENOUS
  Filled 2021-09-02 (×6): qty 100

## 2021-09-02 MED ORDER — LORAZEPAM 1 MG PO TABS: 0.0000 mg | ORAL_TABLET | Freq: Four times a day (QID) | ORAL | Status: AC

## 2021-09-02 MED ORDER — LORAZEPAM 1 MG PO TABS
1.0000 mg | ORAL_TABLET | ORAL | Status: AC | PRN
  Filled 2021-09-02: qty 2

## 2021-09-02 MED ORDER — PROCHLORPERAZINE EDISYLATE 10 MG/2ML IJ SOLN
10.0000 mg | Freq: Four times a day (QID) | INTRAMUSCULAR | Status: DC | PRN
Start: 2021-09-02 — End: 2021-09-05
  Administered 2021-09-02 – 2021-09-05 (×2): 10 mg via INTRAVENOUS
  Filled 2021-09-02 (×2): qty 2

## 2021-09-02 MED ORDER — LORAZEPAM 1 MG PO TABS
0.0000 mg | ORAL_TABLET | Freq: Two times a day (BID) | ORAL | Status: DC
  Administered 2021-09-04: 1 mg via ORAL
  Filled 2021-09-02: qty 1

## 2021-09-02 MED ORDER — THIAMINE HCL 100 MG/ML IJ SOLN
100.0000 mg | Freq: Every day | INTRAMUSCULAR | Status: DC
  Administered 2021-09-02 – 2021-09-03 (×2): 100 mg via INTRAVENOUS
  Filled 2021-09-02 (×2): qty 2

## 2021-09-02 MED ORDER — LACTATED RINGERS IV SOLN: INTRAVENOUS | Status: DC

## 2021-09-02 MED ORDER — LORAZEPAM 2 MG/ML IJ SOLN
4.0000 mg | Freq: Once | INTRAMUSCULAR | Status: AC
  Administered 2021-09-02: 4 mg via INTRAMUSCULAR
  Filled 2021-09-02: qty 2

## 2021-09-02 MED ORDER — FOLIC ACID 1 MG PO TABS
1.0000 mg | ORAL_TABLET | Freq: Every day | ORAL | Status: DC
  Administered 2021-09-04 – 2021-09-05 (×2): 1 mg via ORAL
  Filled 2021-09-02 (×2): qty 1

## 2021-09-02 NOTE — H&P (Signed)
History and Physical    Patient: Terri Maxwell GGE:[id number] DOB: [birth year] DOA: 09/02/2021 DOS: the patient was seen and examined on 09/02/2021 PCP: System, Provider Not In  Patient coming from: Home  Chief Complaint:  Chief Complaint  Patient presents with   Alcohol Problem   HPI: Terri Maxwell is a 56 y.o. female with medical history significant of depression, hemophilia, EtOH abuse, ADHD. Presenting with agitation. History from daughter. The patient let her daughter know that she had decided to stop drinking 4 days ago because she feels like she has an alcohol problem. Her daughter has visited her during this time. She has found her to be shaky and dehydrated. Her daughter notes that the patient's boyfriend picked up the patient's sleeping pills yesterday and gave her one. He left to run errands. When he came back, he noticed that 3 where missing. The patient denied taking them. She requested another sleeping pill. He gave her one more. She then requested another. He then became concerned and brought her to the ED for evaluation.   Review of Systems: unable to review all systems due to the inability of the patient to answer questions. Past Medical History:  Diagnosis Date   Depression    Hemophilia [facility])    PSHx History reviewed. No pertinent surgical history.  Social History: +EtOH, -tobacco/illicit Rx  Allergies  Allergen Reactions   Codeine Shortness Of Breath    Tongue Swelling   Keflex [Cephalexin]     Tongue Swelling   Penicillins     Did it involve swelling of the face/tongue/throat, SOB, or low BP? Yes Did it involve sudden or severe rash/hives, skin peeling, or any reaction on the inside of your mouth or nose? Yes Did you need to seek medical attention at a hospital or doctor's office? Yes When did it last happen?   Over 20 Years Ago    If all above answers are "NO", may proceed with cephalosporin use.     FamHx History reviewed. No pertinent family  history.  Prior to Admission medications   Medication Sig Start Date End Date Taking? Authorizing Provider  methylphenidate 54 MG PO CR tablet Take 54 mg by mouth every morning. 08/28/21  Yes [provider]  zolpidem (AMBIEN CR) 12.5 MG CR tablet Take 12.5 mg by mouth at bedtime as needed for sleep.   Yes [provider]    Physical Exam: Vitals:   09/02/21 0308 09/02/21 0315 09/02/21 0451 09/02/21 0452  BP: (!) 147/85 (!) 155/108 (!) 176/100 (!) 176/100  Pulse: 62 87 65 65  Resp:  (!) 22 (!) 22   Temp:      TempSrc:      SpO2:  95% 97%   Weight:      Height:       General: 56 y.o. female resting in bed in NAD Eyes: PERRL, normal sclera ENMT: Nares patent w/o discharge, orophaynx clear, dentition normal, ears w/o discharge/lesions/ulcers Neck: Supple, trachea midline Cardiovascular: RRR, +S1, S2, no m/g/r, equal pulses throughout Respiratory: CTABL, no w/r/r, normal WOB GI: BS+, NDNT, no masses noted, no organomegaly noted MSK: No e/c/c Skin: No rashes, bruises, ulcerations noted Neuro: somnolent, not following commands, responsive to noxious stim  Data Reviewed:  Na+  139 K+  3.1 CO  28 Glucose 145 BUN  9 SCR  0.72 Ca2+  10.5 Albumin  4.6 WBC  8.5 Hgb  16.4  Assessment and Plan: EtOH abuse EtOH withdrawal     - admit to inpt, progressive     -  fluids, MVI, thiamine, folate     - CIWA protocol     - counsel against further use  Acute metabolic encephalopathy Possible ambien overdose     - spoke with poison control as she has potentially taken 62.5mg  ambien in the last 24hrs; no additional precautions; supportive care     - check UDS, EKG     - fluids     - sitter     - family does not believe this was with intent to harm self; will need screening when she is more alert  Hypokalemia     - replace K+, check Mg2+  Hypercalcemia     - she is dry; give fluids and repeat Ca2+ this evening  Hyperglycemia     - no history of DM, check A1c    HTN     - no previous Hx of HTN, secondary to withdrawal?     - PRN meds available  Depression     - family does not believe this was done with intent to harm self, she will need screening when she is more alert and possible psyc consult  Advance Care Planning:   Code Status: FULL  Consults: None  Family Communication: w/ daughter by phone  Severity of Illness: The appropriate patient status for this patient is INPATIENT. Inpatient status is judged to be reasonable and necessary in order to provide the required intensity of service to ensure the patient's safety. The patient's presenting symptoms, physical exam findings, and initial radiographic and laboratory data in the context of their chronic comorbidities is felt to place them at high risk for further clinical deterioration. Furthermore, it is not anticipated that the patient will be medically stable for discharge from the hospital within 2 midnights of admission.   * I certify that at the point of admission it is my clinical judgment that the patient will require inpatient hospital care spanning beyond 2 midnights from the point of admission due to high intensity of service, high risk for further deterioration and high frequency of surveillance required.*  Author: Teddy Spike, DO 09/02/2021 7:28 AM  For on call review [email or web address].

## 2021-09-02 NOTE — ED Provider Notes (Addendum)
[facility]-EMERGENCY DEPT [facility] Emergency Department Provider Note MRN:  [id number]  Arrival date & time: 09/02/21     Chief Complaint   Alcohol Problem   History of Present Illness   Terri Maxwell is a 56 y.o. year-old female with a history of alcohol use disorder presenting to the ED with chief complaint of alcohol problem.  Patient decided to stop drinking about 5 days ago.  Has been going through severe withdrawal.  She claims that she has not had any alcohol since then.  She appears intoxicated.  Further history obtained from patient's husband, who explains that she has been drinking the Listerine, using excessive Ambien, potentially drinking in secret.  Seem to be actively hallucinating on and off this week, was aggressive this evening.  Review of Systems  I was unable to obtain a full/accurate HPI, PMH, or ROS due to the patient's altered mental status.  Patient's Health History    Past Medical History:  Diagnosis Date   Depression    Hemophilia [facility])     History reviewed. No pertinent surgical history.  History reviewed. No pertinent family history.  Social History   Socioeconomic History   Marital status: Divorced    Spouse name: Not on file   Number of children: Not on file   Years of education: Not on file   Highest education level: Not on file  Occupational History   Not on file  Tobacco Use   Smoking status: Not on file   Smokeless tobacco: Not on file  Substance and Sexual Activity   Alcohol use: Not on file   Drug use: Not on file   Sexual activity: Not on file  Other Topics Concern   Not on file  Social History Narrative   Not on file   Social Determinants of Health   Financial Resource Strain: Not on file  Food Insecurity: Not on file  Transportation Needs: Not on file  Physical Activity: Not on file  Stress: Not on file  Social Connections: Not on file  Intimate Partner Violence: Not on file     Physical Exam   Vitals:    09/02/21 0451 09/02/21 0452  BP: (!) 176/100 (!) 176/100  Pulse: 65 65  Resp: (!) 22   Temp:    SpO2: 97%     CONSTITUTIONAL: Well-appearing, NAD NEURO/PSYCH: Somnolent, wakes to voice, slurred inebriated speech, moves all extremities, mild tremulousness to the upper extremities EYES:  eyes equal and reactive ENT/NECK:  no LAD, no JVD CARDIO: Regular rate, well-perfused, normal S1 and S2 PULM:  CTAB no wheezing or rhonchi GI/GU:  non-distended, non-tender MSK/SPINE:  No gross deformities, no edema SKIN:  no rash, atraumatic   *Additional and/or pertinent findings included in MDM below  Diagnostic and Interventional Summary    EKG Interpretation  Date/Time:    Ventricular Rate:    PR Interval:    QRS Duration:   QT Interval:    QTC Calculation:   R Axis:     Text Interpretation:         Labs Reviewed  CBC - Abnormal; Notable for the following components:      Result Value   Hemoglobin 16.4 (*)    HCT 46.6 (*)    MCV 104.0 (*)    MCH 36.6 (*)    All other components within normal limits  COMPREHENSIVE METABOLIC PANEL - Abnormal; Notable for the following components:   Potassium 3.1 (*)    Glucose, Bld 145 (*)  Calcium 10.5 (*)    Total Bilirubin 1.3 (*)    All other components within normal limits  SALICYLATE LEVEL - Abnormal; Notable for the following components:   Salicylate Lvl <7.0 (*)    All other components within normal limits  ACETAMINOPHEN LEVEL - Abnormal; Notable for the following components:   Acetaminophen (Tylenol), Serum <10 (*)    All other components within normal limits  ETHANOL  LACTIC ACID, PLASMA    No orders to display    Medications  LORazepam (ATIVAN) injection 0-4 mg (2 mg Intravenous Given 09/02/21 0310)    Or  LORazepam (ATIVAN) tablet 0-4 mg ( Oral See Alternative 09/02/21 0310)  LORazepam (ATIVAN) injection 0-4 mg (has no administration in time range)    Or  LORazepam (ATIVAN) tablet 0-4 mg (has no administration in time  range)  thiamine tablet 100 mg (has no administration in time range)    Or  thiamine (B-1) injection 100 mg (has no administration in time range)  lactated ringers infusion ( Intravenous New Bag/Given 09/02/21 0456)  LORazepam (ATIVAN) injection 1 mg (has no administration in time range)  sodium chloride 0.9 % bolus 1,000 mL (0 mLs Intravenous Stopped 09/02/21 0454)  LORazepam (ATIVAN) injection 2 mg (2 mg Intravenous Given 09/02/21 0453)  LORazepam (ATIVAN) injection 4 mg (4 mg Intramuscular Given 09/02/21 0520)  LORazepam (ATIVAN) injection 4 mg (4 mg Intramuscular Given 09/02/21 0618)  ziprasidone (GEODON) injection 20 mg (20 mg Intramuscular Given 09/02/21 0647)  sterile water (preservative free) injection (  Given 09/02/21 0647)     Procedures  /  Critical Care .Critical Care  Performed by: Sabas Sous, MD Authorized by: Sabas Sous, MD   Critical care provider statement:    Critical care time (minutes):  85   Critical care was necessary to treat or prevent imminent or life-threatening deterioration of the following conditions: Acute alcohol withdrawal.   Critical care was time spent personally by me on the following activities:  Development of treatment plan with patient or surrogate, discussions with consultants, evaluation of patient's response to treatment, examination of patient, ordering and review of laboratory studies, ordering and review of radiographic studies, ordering and performing treatments and interventions, pulse oximetry, re-evaluation of patient's condition and review of old charts   ED Course and Medical Decision Making  Initial Impression and Ddx Concern for acute alcohol withdrawal, possibly with delirium tremens given the report of hallucinations.  Starting CIWA, Ativan, obtaining labs, plan is for admission.  Past medical/surgical history that increases complexity of ED encounter: Alcohol use disorder  Interpretation of Diagnostics I personally reviewed  the Cardiac Monitor and my interpretation is as follows: Sinus rhythm  Labs reveal no significant blood count or electrolyte disturbance.  Patient Reassessment and Ultimate Disposition/Management     Admission to hospitalist service.  6:30 AM update: Patient required frequent reassessment, needed to be redirected multiple times, required multiple repeat doses of Ativan to control her behavior.  She is confused, actively withdrawing.  Requiring constant observation, now requiring soft restraints, will try Geodon as an adjunct medication for behavioral control.  Suspect will need stepdown unit.  Patient management required discussion with the following services or consulting groups:  Hospitalist Service  Complexity of Problems Addressed Acute illness or injury that poses threat of life of bodily function  Additional Data Reviewed and Analyzed Further history obtained from: Further history from spouse/family member  Additional Factors Impacting ED Encounter Risk Use of parenteral controlled substances and Consideration of  hospitalization  Elmer SowMichael M. Pilar PlateBero, MD [facility] [email or web address]  Final Clinical Impressions(s) / ED Diagnoses     ICD-10-CM   1. Alcohol withdrawal syndrome with complication [facility](HCC)  [id number]       ED Discharge Orders     None        Discharge Instructions Discussed with and Provided to Patient:   Discharge Instructions   None      Sabas SousBero, Joscelyne Renville M, MD 09/02/21 0407    Sabas SousBero, Aasim Restivo M, MD 09/02/21 [phone]

## 2021-09-02 NOTE — ED Triage Notes (Signed)
  Patient comes in with significant other stating that she needs help getting off alcohol.  Patient states she drinks several small bottle of wine daily and has been trying to stop on her own.  Family states she has tried drinking mouthwash and cologne to get drunk.  Patient PCP gave her prescription for Ambien.  Patient was given [place] by friend and patient thought she didn't take it so she took another.  Patient unable to have straight thoughts and slurring words.  No SI/HI.
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
## 2021-09-03 DIAGNOSIS — F1010 Alcohol abuse, uncomplicated: Secondary | ICD-10-CM

## 2021-09-03 DIAGNOSIS — F32A Depression, unspecified: Secondary | ICD-10-CM | POA: Diagnosis not present

## 2021-09-03 DIAGNOSIS — T50902D Poisoning by unspecified drugs, medicaments and biological substances, intentional self-harm, subsequent encounter: Secondary | ICD-10-CM

## 2021-09-03 DIAGNOSIS — I10 Essential (primary) hypertension: Secondary | ICD-10-CM

## 2021-09-03 DIAGNOSIS — R739 Hyperglycemia, unspecified: Secondary | ICD-10-CM

## 2021-09-03 DIAGNOSIS — G9341 Metabolic encephalopathy: Secondary | ICD-10-CM | POA: Diagnosis not present

## 2021-09-03 DIAGNOSIS — E876 Hypokalemia: Secondary | ICD-10-CM

## 2021-09-03 DIAGNOSIS — F10939 Alcohol use, unspecified with withdrawal, unspecified: Secondary | ICD-10-CM | POA: Diagnosis not present

## 2021-09-03 LAB — COMPREHENSIVE METABOLIC PANEL
ALT: 35 U/L (ref 0–44)
AST: 56 U/L — ABNORMAL HIGH (ref 15–41)
Albumin: 4 g/dL (ref 3.5–5.0)
Alkaline Phosphatase: 70 U/L (ref 38–126)
Anion gap: 11 (ref 5–15)
BUN: 7 mg/dL (ref 6–20)
CO2: 22 mmol/L (ref 22–32)
Calcium: 9.4 mg/dL (ref 8.9–10.3)
Chloride: 105 mmol/L (ref 98–111)
Creatinine, Ser: 0.58 mg/dL (ref 0.44–1.00)
GFR, Estimated: >60 mL/min (ref >60)
Glucose, Bld: 114 mg/dL — ABNORMAL HIGH (ref 70–99)
Potassium: 3 mmol/L — ABNORMAL LOW (ref 3.5–5.1)
Sodium: 138 mmol/L (ref 135–145)
Total Bilirubin: 1.7 mg/dL — ABNORMAL HIGH (ref 0.3–1.2)
Total Protein: 6.9 g/dL (ref 6.5–8.1)

## 2021-09-03 LAB — RAPID URINE DRUG SCREEN, HOSP PERFORMED
Amphetamines: NOT DETECTED
Barbiturates: NOT DETECTED
Benzodiazepines: POSITIVE — AB
Cocaine: NOT DETECTED
Opiates: NOT DETECTED
Tetrahydrocannabinol: POSITIVE — AB

## 2021-09-03 LAB — CBC
HCT: 40.3 % (ref 36.0–46.0)
Hemoglobin: 13.9 g/dL (ref 12.0–15.0)
MCH: 36.2 pg — ABNORMAL HIGH (ref 26.0–34.0)
MCHC: 34.5 g/dL (ref 30.0–36.0)
MCV: 104.9 fL — ABNORMAL HIGH (ref 80.0–100.0)
Platelets: 181 K/uL (ref 150–400)
RBC: 3.84 MIL/uL — ABNORMAL LOW (ref 3.87–5.11)
RDW: 12.7 % (ref 11.5–15.5)
WBC: 7.7 K/uL (ref 4.0–10.5)
nRBC: 0 % (ref 0.0–0.2)

## 2021-09-03 LAB — GLUCOSE, CAPILLARY
Glucose-Capillary: 118 mg/dL — ABNORMAL HIGH (ref 70–99)
Glucose-Capillary: 120 mg/dL — ABNORMAL HIGH (ref 70–99)
Glucose-Capillary: 119 mg/dL — ABNORMAL HIGH (ref 70–99)
Glucose-Capillary: 111 mg/dL — ABNORMAL HIGH (ref 70–99)

## 2021-09-03 LAB — POTASSIUM: Potassium: 3.3 mmol/L — ABNORMAL LOW (ref 3.5–5.1)

## 2021-09-03 MED ORDER — CHLORDIAZEPOXIDE HCL 25 MG PO CAPS
25.0000 mg | ORAL_CAPSULE | Freq: Three times a day (TID) | ORAL | Status: AC
  Administered 2021-09-03 – 2021-09-04 (×2): 25 mg via ORAL
  Filled 2021-09-03 (×2): qty 1

## 2021-09-03 MED ORDER — CHLORDIAZEPOXIDE HCL 25 MG PO CAPS
25.0000 mg | ORAL_CAPSULE | Freq: Two times a day (BID) | ORAL | Status: AC
  Administered 2021-09-04 (×2): 25 mg via ORAL
  Filled 2021-09-03 (×2): qty 1

## 2021-09-03 MED ORDER — SENNOSIDES-DOCUSATE SODIUM 8.6-50 MG PO TABS: 1.0000 | ORAL_TABLET | Freq: Every evening | ORAL | Status: DC | PRN

## 2021-09-03 MED ORDER — HYDRALAZINE HCL 20 MG/ML IJ SOLN
5.0000 mg | Freq: Once | INTRAMUSCULAR | Status: AC
Start: 2021-09-03 — End: 2021-09-03
  Administered 2021-09-03: 5 mg via INTRAVENOUS
  Filled 2021-09-03: qty 1

## 2021-09-03 MED ORDER — PANTOPRAZOLE SODIUM 40 MG PO TBEC
40.0000 mg | DELAYED_RELEASE_TABLET | Freq: Every day | ORAL | Status: DC
  Administered 2021-09-04 – 2021-09-05 (×2): 40 mg via ORAL
  Filled 2021-09-03 (×2): qty 1

## 2021-09-03 MED ORDER — GUAIFENESIN 100 MG/5ML PO LIQD: 5.0000 mL | ORAL | Status: DC | PRN

## 2021-09-03 MED ORDER — VITAMIN B-12 1000 MCG PO TABS
1000.0000 ug | ORAL_TABLET | Freq: Every day | ORAL | Status: DC
  Administered 2021-09-04 – 2021-09-05 (×2): 1000 ug via ORAL
  Filled 2021-09-03 (×2): qty 1

## 2021-09-03 MED ORDER — CHLORDIAZEPOXIDE HCL 25 MG PO CAPS: 25.0000 mg | ORAL_CAPSULE | Freq: Every day | ORAL | Status: DC

## 2021-09-03 MED ORDER — INSULIN ASPART 100 UNIT/ML IJ SOLN: 0.0000 [IU] | Freq: Three times a day (TID) | INTRAMUSCULAR | Status: DC

## 2021-09-03 MED ORDER — HYDRALAZINE HCL 20 MG/ML IJ SOLN
10.0000 mg | INTRAMUSCULAR | Status: DC | PRN
  Administered 2021-09-04: 10 mg via INTRAVENOUS
  Filled 2021-09-03: qty 1

## 2021-09-03 MED ORDER — POTASSIUM CHLORIDE 10 MEQ/100ML IV SOLN
INTRAVENOUS | Status: AC
  Filled 2021-09-03: qty 100

## 2021-09-03 MED ORDER — IPRATROPIUM-ALBUTEROL 0.5-2.5 (3) MG/3ML IN SOLN: 3.0000 mL | RESPIRATORY_TRACT | Status: DC | PRN

## 2021-09-03 MED ORDER — POTASSIUM CHLORIDE 10 MEQ/100ML IV SOLN
10.0000 meq | INTRAVENOUS | Status: AC
  Administered 2021-09-03 (×4): 10 meq via INTRAVENOUS
  Filled 2021-09-03 (×3): qty 100

## 2021-09-03 MED ORDER — SODIUM CHLORIDE 0.9 % IV SOLN: INTRAVENOUS | Status: AC

## 2021-09-03 MED ORDER — HALOPERIDOL LACTATE 5 MG/ML IJ SOLN: 5.0000 mg | Freq: Four times a day (QID) | INTRAMUSCULAR | Status: DC | PRN

## 2021-09-03 MED ORDER — ACETAMINOPHEN 325 MG PO TABS: 650.0000 mg | ORAL_TABLET | Freq: Four times a day (QID) | ORAL | Status: DC | PRN

## 2021-09-03 MED ORDER — OXYCODONE HCL 5 MG PO TABS
5.0000 mg | ORAL_TABLET | ORAL | Status: DC | PRN
  Filled 2021-09-03: qty 1
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
## 2021-09-03 NOTE — Progress Notes (Signed)
PROGRESS NOTE    Terri Maxwell  [id number] DOB: July 24, 1965 DOA: 09/02/2021 PCP: System, Provider Not In   Brief Narrative:  56 year old with history of depression, hemophilia, alcohol abuse, ADHD hospital for agitation.  Upon admission history was mostly per her daughter who stated patient stopped drinking about 4 days prior to admission.  She was admitted to the hospital for signs and symptoms of severe agitation from alcohol withdrawal and likely encephalopathy from Ambien overdose.   Assessment & Plan:  Principal Problem:   Alcohol withdrawal ([place]) Active Problems:   Alcohol abuse   Depression   Acute metabolic encephalopathy   Overdose   Hypokalemia   Hypercalcemia   Hyperglycemia   HTN (hypertension)    Alcohol abuse with signs of withdrawal Agitation - Currently patient is on alcohol withdrawal protocol and restraints as well - Multivitamin, folic acid and thiamine.  Ordered Librium taper - IV fluids. PPI daily  Acute metabolic encephalopathy secondary to Ambien overdose -Drowsy as she received sedative earlier due to anxiety -UDS is still pending - EKG shows normal sinus rhythm with normal QTc - Alcohol, acetaminophen and salicylate levels are negative  Hypokalemia/hypomagnesemia -Continue aggressive repletion as necessary  Hypercalcemia -Admission calcium 10.5 likely from dehydration.  Resolved this morning.  Hyperglycemia -Hemoglobin A1c 5.9.  Continue sliding scale and Accu-Cheks.  Macrocytosis - Suspect from alcohol use.  Folate is normal.  Borderline low B12 therefore will start repletion  Vitamin B12 deficiency - Vitamin B12 supplements initiated  Elevated blood pressure - No prior history of this.  Likely from alcohol withdrawal.  We will manage this with as needed medications  Depression - Not on any home meds   DVT prophylaxis: SCDs Start: 09/02/21 1059 Code Status: Full code Family Communication:  Called her daughter- updated.    Status is: Inpatient Remains inpatient appropriate because: Maintain hosp stay for concerns of withdrawal.     Subjective: Very agitated overnight, requiring meds. This morning she is drowsy, moving all extremities but unable to participate in conversation.   Daughter tells me she has been drinking 2 bottles of wine a day and decided to quite drinking all of a sudden 3 days prior to admission. Prior to admission she has been withdrawing and ptn was worried about seizures. She was prescribed Ambien by her pcp recently but ptn took multiple tablets due to insomnia.     Examination:  General exam: drwosy Respiratory system: Clear to auscultation. Respiratory effort normal. Cardiovascular system: NSR Gastrointestinal system: Abdomen is nondistended, soft No organomegaly or masses felt. Normal bowel sounds heard. Central nervous system: difficulty to assess, no focal neuro deficit.  Extremities: no edema Skin: No rashes, lesions or ulcers Psychiatry: Judgement and insight - poor  Objective: Vitals:   09/03/21 0037 09/03/21 0305 09/03/21 0542 09/03/21 0542  BP: (!) 164/103 (!) 186/118 (!) 174/114 (!) 175/114  Pulse: 67 (!) 107 93 93  Resp:      Temp: 98.5 F (36.9 C)     TempSrc: Oral     SpO2: 93%   95%  Weight:      Height:        Intake/Output Summary (Last 24 hours) at 09/03/2021 0750 Last data filed at 09/02/2021 2137 Gross per 24 hour  Intake 397.12 ml  Output --  Net 397.12 ml   Filed Weights   09/02/21 0108  Weight: 49.4 kg     Data Reviewed:   CBC: Recent Labs  Lab 09/02/21 0245 09/03/21 0601  WBC 8.5 7.7  HGB 16.4* 13.9  HCT 46.6* 40.3  MCV 104.0* 104.9*  PLT 210 0000000   Basic Metabolic Panel: Recent Labs  Lab 09/02/21 0245 09/02/21 1104 09/03/21 0601  NA 139 136 138  K 3.1* 2.9* 3.0*  CL 100 102 105  CO2 28 25 22   GLUCOSE 145* 124* 114*  BUN 9 8 7   CREATININE 0.72 0.52 0.58  CALCIUM 10.5* 9.4 9.4  MG  --  2.0  --   PHOS  --  5.1*   4.8*  --    GFR: Estimated Creatinine Clearance: 58.4 mL/min (by C-G formula based on SCr of 0.58 mg/dL). Liver Function Tests: Recent Labs  Lab 09/02/21 0245 09/02/21 1104 09/03/21 0601  AST 38  --  56*  ALT 36  --  35  ALKPHOS 81  --  70  BILITOT 1.3*  --  1.7*  PROT 7.7  --  6.9  ALBUMIN 4.6 4.1 4.0   No results for input(s): "LIPASE", "AMYLASE" in the last 168 hours. No results for input(s): "AMMONIA" in the last 168 hours. Coagulation Profile: No results for input(s): "INR", "PROTIME" in the last 168 hours. Cardiac Enzymes: No results for input(s): "CKTOTAL", "CKMB", "CKMBINDEX", "TROPONINI" in the last 168 hours. BNP (last 3 results) No results for input(s): "PROBNP" in the last 8760 hours. HbA1C: Recent Labs    09/02/21 1104  HGBA1C 5.9*   CBG: No results for input(s): "GLUCAP" in the last 168 hours. Lipid Profile: No results for input(s): "CHOL", "HDL", "LDLCALC", "TRIG", "CHOLHDL", "LDLDIRECT" in the last 72 hours. Thyroid Function Tests: No results for input(s): "TSH", "T4TOTAL", "FREET4", "T3FREE", "THYROIDAB" in the last 72 hours. Anemia Panel: Recent Labs    09/02/21 1104  VITAMINB12 161*  FOLATE 11.8   Sepsis Labs: Recent Labs  Lab 09/02/21 0246  LATICACIDVEN 1.3    No results found for this or any previous visit (from the past 240 hour(s)).       Radiology Studies: No results found.      Scheduled Meds:  folic acid  1 mg Oral Daily   hydrALAZINE  5 mg Intravenous Once   LORazepam  0-4 mg Intravenous Q6H   Or   LORazepam  0-4 mg Oral Q6H   [START ON 09/04/2021] LORazepam  0-4 mg Intravenous Q12H   Or   [START ON 09/04/2021] LORazepam  0-4 mg Oral Q12H   multivitamin with minerals  1 tablet Oral Daily   thiamine  100 mg Oral Daily   Or   thiamine  100 mg Intravenous Daily   Continuous Infusions:  lactated ringers 125 mL/hr at 09/03/21 0419   potassium chloride       LOS: 1 day   Time spent= 35 mins    Brysin Towery Arsenio Loader, MD [facility]  If 7PM-7AM, please contact night-coverage  09/03/2021, 7:50 AM

## 2021-09-03 NOTE — Progress Notes (Signed)
   09/03/21 0900  Urine Characteristics  Urinary Interventions Bladder scan  Bladder Scan Volume (mL) 44 mL   Bladder scanned, per MD order.  Sinclair Ship, RN

## 2021-09-03 NOTE — Consult Note (Signed)
Face-to-Face Psychiatry Consult   Reason for Consult:  etoh and Ambien overdose Referring Physician:  A. Reesa Chew, MD  Patient Identification: Terri Maxwell MRN:  [id number] Principal Diagnosis: Alcohol withdrawal ([place]) Diagnosis:  Principal Problem:   Alcohol withdrawal ([place]) Active Problems:   Alcohol abuse   Depression   Acute metabolic encephalopathy   Overdose   Hypokalemia   Hypercalcemia   Hyperglycemia   HTN (hypertension)   Total Time spent with patient: 30 minutes  Subjective:   Terri Maxwell is a 56 y.o. female with medical history significant of depression, hemophilia, EtOH abuse, ADHD. Presenting with agitation. History from daughter. The patient let her daughter know that she had decided to stop drinking 4 days ago because she feels like she has an alcohol problem. Her daughter has visited her during this time. She has found her to be shaky and dehydrated. Her daughter notes that the patient's boyfriend picked up the patient's sleeping pills yesterday and gave her one. He left to run errands. When he came back, he noticed that 3 where missing. The patient denied taking them. She requested another sleeping pill. He gave her one more. She then requested another. He then became concerned and brought her to the ED for evaluation.   HPI:   Patient is overall a poor historian.  She is alert and oriented only to self.  She believes she is at [facility], the date is November 04, 2023, that she was brought to the hospital "we came with my daughter because she was smoking weed and she needed to go to a meeting".  The patient does not recall events leading up to the hospitalization.  She reports her last drink was 18 years ago.  She denies taking Ambien for about 25 years.  When assessed for suicidal thoughts the patient states "only the other day.  I could not get out I was trying to help my daughter.  We went to see the luxury suite.  I was going to drink water and go to bed."   She does not elaborate further when asked further questioning about suicidal thoughts.  Her responses are nonsensical in response to most questions throughout the entirety of the interview.  Denies HI.  In regards to symptoms of Axis I disorders, the patient reports that her mood is up and down.  She does oddly start crying when asked for her boyfriend's telephone number during the interview.  She reports that her sleep is "horrendous".  She reports appetite is less.  She reports visual hallucinations of bunnies and claims to have killed 1.  Denies AH.  Denies tactile hallucinations.  In regards to anxiety, the patient denies anxiety is excessive or overwhelming, but reports "I am around people that do have anxiety".  She gives permission for this writer to contact her daughter and also the boyfriend.  Upon exiting the room, the patient was asked if she had any additional questions, at which time she responded "what size rollerskates to do where ?"  Past psychiatric history: Patient reports history of insomnia and ADHD.  Reports last taking Ambien 25 years ago.  Reports she does take Concerta 34 mg.  Denies any history of psychiatric hospitalization.  Denies any history of suicide attempt.  Past medical history: Hemophilia Denies history of seizure Reports history of surgeries: Ankle surgery, knee surgery, and 2 C-sections.  Reports allergies to codeine, Zofran, penicillin  Family history: Denies any psychiatric family history.  Denies any suicide attempts in the family.  Substance use history: Patient reports last drink was 18 years ago.  Reports at that time she was drinking 1 bottle of wine per day, on the boat, on the weekends, only during the summer.  Denies any other nicotine or tobacco use.  Denies any marijuana use.  Denies any other illicit drug use.  Attempted to call significant other [phone] at 2:52pm on 09-03-2021, no answer.   [place] [phone] -  -Daughter  verifies history in IM H&P. Pt quit cold [place] on Monday. On Wednesday, pt was worried that she was going to have a seizure. She called her doctor asking for sleep med, and was prescribed ambien.  -In the past, she had intentionally overdosed, 15 years ago +/- -Daughter reports that  the pt takes a sleeping med, and forgets that she had taken it, and gets mad when she she isn't given another med. Daughter reports that she does not believe that pt took the multiple doses of ambien as an intention medication overdose as an attempt to harm herself or end her life.     Risk to Self:   pt is a risk to self due to substance use, and precipitating a medically dangerous withdrawal, and taking more medication than prescribed, but this does not appear to warrant inpatient psych hospitalization. Will need to confirm history when patient is lucid.  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 No pertinent surgical history. Family History: History reviewed. No pertinent family history.  Social History:  Social History   Substance and Sexual Activity  Alcohol Use Yes     Social History   Substance and Sexual Activity  Drug Use Not on file    Social History   Socioeconomic History   Marital status: Divorced    Spouse name: Not on file   Number of children: Not on file   Years of education: Not on file   Highest education level: Not on file  Occupational History   Not on file  Tobacco Use   Smoking status: Former    Types: Cigarettes   Smokeless tobacco: Never  Substance and Sexual Activity   Alcohol use: Yes   Drug use: Not on file   Sexual activity: Not on file  Other Topics Concern   Not on file  Social History Narrative   Not on file   Social Determinants of Health   Financial Resource Strain: Not on file  Food  Insecurity: Not on file  Transportation Needs: Not on file  Physical Activity: Not on file  Stress: Not on file  Social Connections: Not on file   Additional Social History:    Allergies:   Allergies  Allergen Reactions   Codeine Shortness Of Breath    Tongue Swelling   Keflex [Cephalexin]     Tongue Swelling   Penicillins     Did it involve swelling of the face/tongue/throat, SOB, or low BP? Yes Did it involve sudden or severe rash/hives, skin peeling, or any reaction on the inside of your mouth or nose? Yes Did you need to seek medical attention at a hospital or doctor's office? Yes When did it last happen?   Over 20 Years Ago    If all above answers are "NO", may proceed with cephalosporin use.  Labs:  Results for orders placed or performed during the hospital encounter of 09/02/21 (from the past 48 hour(s))  CBC     Status: Abnormal   Collection Time: 09/02/21  2:45 AM  Result Value Ref Range   WBC 8.5 4.0 - 10.5 K/uL   RBC 4.48 3.87 - 5.11 MIL/uL   Hemoglobin 16.4 (H) 12.0 - 15.0 g/dL   HCT 46.6 (H) 36.0 - 46.0 %   MCV 104.0 (H) 80.0 - 100.0 fL   MCH 36.6 (H) 26.0 - 34.0 pg   MCHC 35.2 30.0 - 36.0 g/dL   RDW 12.6 11.5 - 15.5 %   Platelets 210 150 - 400 K/uL   nRBC 0.0 0.0 - 0.2 %    Comment: Performed at [facility], [place] [address] [place], [place] [address]  Comprehensive metabolic panel     Status: Abnormal   Collection Time: 09/02/21  2:45 AM  Result Value Ref Range   Sodium 139 135 - 145 mmol/L   Potassium 3.1 (L) 3.5 - 5.1 mmol/L   Chloride 100 98 - 111 mmol/L   CO2 28 22 - 32 mmol/L   Glucose, Bld 145 (H) 70 - 99 mg/dL    Comment: Glucose reference range applies only to samples taken after fasting for at least 8 hours.   BUN 9 6 - 20 mg/dL   Creatinine, Ser 0.72 0.44 - 1.00 mg/dL   Calcium 10.5 (H) 8.9 - 10.3 mg/dL   Total Protein 7.7 6.5 - 8.1 g/dL   Albumin 4.6 3.5 - 5.0 g/dL   AST 38 15 - 41 U/L   ALT 36 0 - 44 U/L    Alkaline Phosphatase 81 38 - 126 U/L   Total Bilirubin 1.3 (H) 0.3 - 1.2 mg/dL   GFR, Estimated >60 >60 mL/min    Comment: (NOTE) Calculated using the CKD-EPI Creatinine Equation (2021)    Anion gap 11 5 - 15    Comment: Performed at [facility], [place] [address] [place], [place] [address]  Ethanol     Status: None   Collection Time: 09/02/21  2:45 AM  Result Value Ref Range   Alcohol, Ethyl (B) <10 <10 mg/dL    Comment: (NOTE) Lowest detectable limit for serum alcohol is 10 mg/dL.  For medical purposes only. Performed at [facility], [place] [address], [place], [place] [id number]   Salicylate level     Status: Abnormal   Collection Time: 09/02/21  2:45 AM  Result Value Ref Range   Salicylate Lvl [id number] (L) 7.0 - 30.0 mg/dL    Comment: Performed at [facility], [place] [address] [place], [place] [address]  Acetaminophen level     Status: Abnormal   Collection Time: 09/02/21  2:45 AM  Result Value Ref Range   Acetaminophen (Tylenol), Serum <10 (L) 10 - 30 ug/mL    Comment: (NOTE) Therapeutic concentrations vary significantly. A range of 10-30 ug/mL  may be an effective concentration for many patients. However, some  are best treated at concentrations outside of this range. Acetaminophen concentrations >150 ug/mL at 4 hours after ingestion  and >50 ug/mL at 12 hours after ingestion are often associated with  toxic reactions.  Performed at [facility], [place] [address], [place], [place] [address]   Lactic acid, plasma     Status: None   Collection Time: 09/02/21  2:46 AM  Result Value Ref Range   Lactic Acid, Venous 1.3 0.5 -  1.9 mmol/L    Comment: Performed at [facility], [place] [address] [place], [place] [address]  Magnesium     Status: None   Collection Time: 09/02/21 11:04 AM  Result Value Ref Range   Magnesium 2.0 1.7 - 2.4 mg/dL    Comment: Performed at [facility], [place] [address], [place], [place] [address]  Phosphorus     Status: Abnormal   Collection Time: 09/02/21 11:04 AM  Result Value Ref Range   Phosphorus 4.8 (H) 2.5 - 4.6 mg/dL    Comment: Performed at [facility], [place] [address], [place], [place] [address]  Vitamin B12     Status: Abnormal   Collection Time: 09/02/21 11:04 AM  Result Value Ref Range   Vitamin B-12 161 (L) 180 - 914 pg/mL    Comment: (NOTE) This assay is not validated for testing neonatal or myeloproliferative syndrome specimens for Vitamin B12 levels. Performed at [facility], [place] [address] [place], [place] [address]   Folate     Status: None   Collection Time: 09/02/21 11:04 AM  Result Value Ref Range   Folate 11.8 >5.9 ng/mL    Comment: Performed at [facility], [place] [address], [place], [place] [address]  Hemoglobin A1c     Status: Abnormal   Collection Time: 09/02/21 11:04 AM  Result Value Ref Range   Hgb A1c MFr Bld 5.9 (H) 4.8 - 5.6 %    Comment: (NOTE) Pre diabetes:          5.7%-6.4%  Diabetes:              >6.4%  Glycemic control for   <7.0% adults with diabetes    Mean Plasma Glucose 122.63 mg/dL    Comment: Performed at [place] [address] [place], [place] [address]  HIV Antibody (routine testing w rflx)     Status: None   Collection Time: 09/02/21 11:04 AM  Result Value Ref Range   HIV Screen 4th Generation wRfx Non Reactive Non Reactive    Comment: Performed at [facility], [place] [address], [place], [place] [address]  Renal function panel     Status: Abnormal   Collection Time: 09/02/21 11:04 AM  Result Value Ref Range   Sodium 136 135 - 145 mmol/L   Potassium 2.9 (L) 3.5 - 5.1 mmol/L   Chloride 102 98 - 111 mmol/L   CO2 25 22 - 32 mmol/L   Glucose, Bld 124 (H) 70 - 99 mg/dL    Comment: Glucose reference range applies only to samples taken after fasting for at least 8 hours.   BUN 8 6 - 20 mg/dL    Creatinine, Ser 0.52 0.44 - 1.00 mg/dL   Calcium 9.4 8.9 - 10.3 mg/dL   Phosphorus 5.1 (H) 2.5 - 4.6 mg/dL   Albumin 4.1 3.5 - 5.0 g/dL   GFR, Estimated >60 >60 mL/min    Comment: (NOTE) Calculated using the CKD-EPI Creatinine Equation (2021)    Anion gap 9 5 - 15    Comment: Performed at [facility], [place] [address], [place], [place] [address]  Comprehensive metabolic panel     Status: Abnormal   Collection Time: 09/03/21  6:01 AM  Result Value Ref Range   Sodium 138 135 - 145 mmol/L   Potassium 3.0 (L) 3.5 - 5.1 mmol/L   Chloride 105 98 - 111 mmol/L   CO2 22 22 -  32 mmol/L   Glucose, Bld 114 (H) 70 - 99 mg/dL    Comment: Glucose reference range applies only to samples taken after fasting for at least 8 hours.   BUN 7 6 - 20 mg/dL   Creatinine, Ser 0.58 0.44 - 1.00 mg/dL   Calcium 9.4 8.9 - 10.3 mg/dL   Total Protein 6.9 6.5 - 8.1 g/dL   Albumin 4.0 3.5 - 5.0 g/dL   AST 56 (H) 15 - 41 U/L   ALT 35 0 - 44 U/L   Alkaline Phosphatase 70 38 - 126 U/L   Total Bilirubin 1.7 (H) 0.3 - 1.2 mg/dL   GFR, Estimated >60 >60 mL/min    Comment: (NOTE) Calculated using the CKD-EPI Creatinine Equation (2021)    Anion gap 11 5 - 15    Comment: Performed at [facility], [place] [address] [place], [place] [address]  CBC     Status: Abnormal   Collection Time: 09/03/21  6:01 AM  Result Value Ref Range   WBC 7.7 4.0 - 10.5 K/uL   RBC 3.84 (L) 3.87 - 5.11 MIL/uL   Hemoglobin 13.9 12.0 - 15.0 g/dL   HCT 40.3 36.0 - 46.0 %   MCV 104.9 (H) 80.0 - 100.0 fL   MCH 36.2 (H) 26.0 - 34.0 pg   MCHC 34.5 30.0 - 36.0 g/dL   RDW 12.7 11.5 - 15.5 %   Platelets 181 150 - 400 K/uL   nRBC 0.0 0.0 - 0.2 %    Comment: Performed at [facility], [place] [address] [place], [place] [address]  Glucose, capillary     Status: Abnormal   Collection Time: 09/03/21  8:18 AM  Result Value Ref Range   Glucose-Capillary 118 (H) 70 - 99 mg/dL    Comment: Glucose  reference range applies only to samples taken after fasting for at least 8 hours.  Glucose, capillary     Status: Abnormal   Collection Time: 09/03/21 12:53 PM  Result Value Ref Range   Glucose-Capillary 120 (H) 70 - 99 mg/dL    Comment: Glucose reference range applies only to samples taken after fasting for at least 8 hours.    Current Facility-Administered Medications  Medication Dose Route Frequency Provider Last Rate Last Admin   0.9 %  sodium chloride infusion   Intravenous Continuous Damita Lack, MD 75 mL/hr at 09/03/21 0938 New Bag at 09/03/21 [id number]   acetaminophen (TYLENOL) tablet 650 mg  650 mg Oral Q6H PRN Amin, Ankit Chirag, MD       chlordiazePOXIDE (LIBRIUM) capsule 25 mg  25 mg Oral TID Damita Lack, MD       Followed by   Derrill Memo ON 09/04/2021] chlordiazePOXIDE (LIBRIUM) capsule 25 mg  25 mg Oral BID Amin, Ankit Chirag, MD       Followed by   Derrill Memo ON 09/05/2021] chlordiazePOXIDE (LIBRIUM) capsule 25 mg  25 mg Oral Q1500 Amin, Ankit Chirag, MD       folic acid (FOLVITE) tablet 1 mg  1 mg Oral Daily Kyle, Tyrone A, DO       guaiFENesin (ROBITUSSIN) 100 MG/5ML liquid 5 mL  5 mL Oral Q4H PRN Amin, Ankit Chirag, MD       haloperidol lactate (HALDOL) injection 5 mg  5 mg Intravenous Q6H PRN Amin, Ankit Chirag, MD       hydrALAZINE (APRESOLINE) injection 10 mg  10 mg Intravenous Q4H PRN Amin, Jeanella Flattery, MD  insulin aspart (novoLOG) injection 0-9 Units  0-9 Units Subcutaneous TID WC Amin, Ankit Chirag, MD       ipratropium-albuterol (DUONEB) 0.5-2.5 (3) MG/3ML nebulizer solution 3 mL  3 mL Nebulization Q4H PRN Amin, Ankit Chirag, MD       LORazepam (ATIVAN) injection 0-4 mg  0-4 mg Intravenous Q6H Kyle, Tyrone A, DO   2 mg at 09/03/21 0932   Or   LORazepam (ATIVAN) tablet 0-4 mg  0-4 mg Oral Q6H Kyle, Tyrone A, DO       [START ON 09/04/2021] LORazepam (ATIVAN) injection 0-4 mg  0-4 mg Intravenous Q12H Kyle, Tyrone A, DO       Or   [START ON 09/04/2021] LORazepam  (ATIVAN) tablet 0-4 mg  0-4 mg Oral Q12H Kyle, Tyrone A, DO       LORazepam (ATIVAN) tablet 1-4 mg  1-4 mg Oral Q1H PRN Marylyn Ishihara, Tyrone A, DO       Or   LORazepam (ATIVAN) injection 1-4 mg  1-4 mg Intravenous Q1H PRN Marylyn Ishihara, Tyrone A, DO   2 mg at 09/03/21 0543   multivitamin with minerals tablet 1 tablet  1 tablet Oral Daily Kyle, Tyrone A, DO       oxyCODONE (Oxy IR/ROXICODONE) immediate release tablet 5 mg  5 mg Oral Q4H PRN Amin, Ankit Chirag, MD       pantoprazole (PROTONIX) EC tablet 40 mg  40 mg Oral Daily Amin, Ankit Chirag, MD       prochlorperazine (COMPAZINE) injection 10 mg  10 mg Intravenous Q6H PRN Marylyn Ishihara, Tyrone A, DO   10 mg at 09/02/21 1708   senna-docusate (Senokot-S) tablet 1 tablet  1 tablet Oral QHS PRN Amin, Ankit Chirag, MD       thiamine tablet 100 mg  100 mg Oral Daily Kyle, Tyrone A, DO       Or   thiamine (B-1) injection 100 mg  100 mg Intravenous Daily Kyle, Tyrone A, DO   100 mg at 09/03/21 [id number]   vitamin B-12 (CYANOCOBALAMIN) tablet 1,000 mcg  1,000 mcg Oral Daily Amin, Jeanella Flattery, MD        Musculoskeletal: Strength & Muscle Tone: Laying in bed   Gait & Station: Laying in bed   Patient leans: Laying in bed             Psychiatric Specialty Exam:  Presentation  General Appearance: Disheveled  Eye Contact:Fair  Speech:Normal Rate; Garbled  Speech Volume:Normal  Handedness:No data recorded  Mood and Affect  Mood:Anxious; Labile  Affect:Full Range   Thought Process  Thought Processes:Irrevelant  Descriptions of Associations:Intact  Orientation:Partial (A&O to self only (not place, time or situation))  Thought Content:Illogical  History of Schizophrenia/Schizoaffective disorder:No data recorded Duration of Psychotic Symptoms:No data recorded Hallucinations:Hallucinations: Visual Description of Visual Hallucinations: rabbits  Ideas of Reference:None  Suicidal Thoughts:Suicidal Thoughts: No  Homicidal Thoughts:Homicidal Thoughts:  No   Sensorium  Memory:Immediate Poor; Recent Poor; Remote Fair  Judgment:Poor  Insight:Poor   Executive Functions  Concentration:Poor  Attention Span:Poor  Recall:No data recorded [place] recorded Language:No data recorded  Psychomotor Activity  Psychomotor Activity:Psychomotor Activity: Normal   Assets  Assets:No data recorded  Sleep  Sleep:Sleep: Poor   Physical Exam: Physical Exam Vitals reviewed.  Pulmonary:     Effort: Pulmonary effort is normal.   Review of Systems  Psychiatric/Behavioral:  Positive for hallucinations and substance abuse. The patient is nervous/anxious and has insomnia.    Blood pressure (!) 126/92, pulse  96, temperature 98.2 F (36.8 C), temperature source Oral, resp. rate 20, height 5' (1.524 m), weight 49.4 kg, SpO2 96 %. Body mass index is 21.29 kg/m.  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 Per daughter, this was not an intentional medication overdose. Until the more reliable history can be obtained from the patient herself, it is difficult to assess the patient's risk and need for inpatient psychiatric hospitalization -Continue alcohol withdrawal management by primary team -Consider high dose IV thiamine  -pt should be offered substance use treatment, including residential treatment options  Disposition: Psychiatric consult liaison team will continue to follow  Christoper Allegra, MD 09/03/2021 2:43  PM  Total Time Spent in Direct Patient Care:  I personally spent 45 minutes on the unit in direct patient care. The direct patient care time included face-to-face time with the patient, reviewing the patient's chart, communicating with other professionals, and coordinating care. Greater than 50% of this time was spent in counseling or coordinating care with the patient regarding goals of hospitalization, psycho-education, and discharge planning needs.   Janine Limbo, MD Psychiatrist

## 2021-09-03 NOTE — Progress Notes (Signed)
Patient alert and oriented x3. Patient is compliant with safety measures. Sitter at bedside. Bilateral soft wrists restraints removed and order discontinued. Will continue to monitor closely.

## 2021-09-04 DIAGNOSIS — F10931 Alcohol use, unspecified with withdrawal delirium: Secondary | ICD-10-CM | POA: Diagnosis not present

## 2021-09-04 DIAGNOSIS — F1010 Alcohol abuse, uncomplicated: Secondary | ICD-10-CM | POA: Diagnosis not present

## 2021-09-04 DIAGNOSIS — F32A Depression, unspecified: Secondary | ICD-10-CM | POA: Diagnosis not present

## 2021-09-04 DIAGNOSIS — G9341 Metabolic encephalopathy: Secondary | ICD-10-CM | POA: Diagnosis not present

## 2021-09-04 LAB — CBC
HCT: 41.2 % (ref 36.0–46.0)
Hemoglobin: 14.5 g/dL (ref 12.0–15.0)
MCH: 37.1 pg — ABNORMAL HIGH (ref 26.0–34.0)
MCHC: 35.2 g/dL (ref 30.0–36.0)
MCV: 105.4 fL — ABNORMAL HIGH (ref 80.0–100.0)
Platelets: 210 10*3/uL (ref 150–400)
RBC: 3.91 MIL/uL (ref 3.87–5.11)
RDW: 12.8 % (ref 11.5–15.5)
WBC: 6.3 10*3/uL (ref 4.0–10.5)
nRBC: 0 % (ref 0.0–0.2)

## 2021-09-04 LAB — MAGNESIUM: Magnesium: 1.9 mg/dL (ref 1.7–2.4)

## 2021-09-04 LAB — COMPREHENSIVE METABOLIC PANEL
ALT: 34 U/L (ref 0–44)
AST: 47 U/L — ABNORMAL HIGH (ref 15–41)
Albumin: 3.7 g/dL (ref 3.5–5.0)
Alkaline Phosphatase: 73 U/L (ref 38–126)
Anion gap: 8 (ref 5–15)
BUN: 7 mg/dL (ref 6–20)
CO2: 23 mmol/L (ref 22–32)
Calcium: 9.4 mg/dL (ref 8.9–10.3)
Chloride: 108 mmol/L (ref 98–111)
Creatinine, Ser: 0.58 mg/dL (ref 0.44–1.00)
GFR, Estimated: >60 mL/min (ref >60)
Glucose, Bld: 107 mg/dL — ABNORMAL HIGH (ref 70–99)
Potassium: 3.3 mmol/L — ABNORMAL LOW (ref 3.5–5.1)
Sodium: 139 mmol/L (ref 135–145)
Total Bilirubin: 1.2 mg/dL (ref 0.3–1.2)
Total Protein: 6.6 g/dL (ref 6.5–8.1)

## 2021-09-04 LAB — GLUCOSE, CAPILLARY
Glucose-Capillary: 108 mg/dL — ABNORMAL HIGH (ref 70–99)
Glucose-Capillary: 95 mg/dL (ref 70–99)
Glucose-Capillary: 111 mg/dL — ABNORMAL HIGH (ref 70–99)
Glucose-Capillary: 118 mg/dL — ABNORMAL HIGH (ref 70–99)

## 2021-09-04 MED ORDER — BOOST / RESOURCE BREEZE PO LIQD CUSTOM: 1.0000 | ORAL | Status: DC

## 2021-09-04 MED ORDER — POTASSIUM CHLORIDE 10 MEQ/100ML IV SOLN
10.0000 meq | INTRAVENOUS | Status: AC
  Administered 2021-09-04 (×4): 10 meq via INTRAVENOUS
  Filled 2021-09-04 (×4): qty 100

## 2021-09-04 MED ORDER — BOOST PLUS PO LIQD
237.0000 mL | ORAL | Status: DC
  Administered 2021-09-04: 237 mL via ORAL
  Filled 2021-09-04: qty 237

## 2021-09-04 NOTE — Consult Note (Signed)
Face-to-Face Psychiatry Consult   Reason for Consult:  etoh and Ambien overdose Referring Physician:  A. Nelson ChimesAmin, MD  Patient Identification: Terri Maxwell MRN:  [id number] Principal Diagnosis: Alcohol withdrawal (HCC) Diagnosis:  Principal Problem:   Alcohol withdrawal (HCC) Active Problems:   Alcohol abuse   Depression   Acute metabolic encephalopathy   Overdose   Hypokalemia   Hypercalcemia   Hyperglycemia   HTN (hypertension)   Total Time spent with patient: 30 minutes  Subjective:  "Im better" Terri Maxwell is a 56 y.o. female with medical history significant of depression, hemophilia, EtOH abuse, ADHD. Presenting with agitation.   HPI:   Patient states she is doing better, just tired. She identifies her mood as "better. Im ok." She is alert and oriented x 4, she is pleasant and engages well with this provider. She answers all questions appropriately, and sits up to engage in reassessment with this provider. She denies any physical symptoms of acute alcohol withdraw at this time. She does acknowledge she is still reviewing IV medications and that it maybe be contributing to reduction in symptoms. She denies any auditory, visual, or tactile hallucinations at this time. She currently has volition to initiate changes in her future. She states she would prefer a (all-womens) group to help with residential program. She states she spends a majority of her time researching addiction " what cures alcohol addiction? Why addiction is so hard to quit? Local support groups". She reports that historically she has found that women groups are more motivational and supporting vs men support groups are "like a club."  Patient denies any history or current symptoms of depression.  She states she didn't drink at all while her daughter was growing up, and she knows that she can do it again. There is no history of polysubstance abuse in her family. She denies any underlying psychiatric condition that  contributes to her depression. She denies any attempt at suicide in her life.  Patient specifically denies any suicidal ideation, plan, or intent.  She denies ever drinking alcohol as an attempt to end her life.  She denies any homicidal ideation.  Past psychiatric history: Patient reports history of insomnia and ADHD.  Reports last taking Ambien 25 years ago.  Reports she does take Concerta 34 mg.  Denies any history of psychiatric hospitalization.  Denies any history of suicide attempt.  Past medical history: Hemophilia Denies history of seizure Reports history of surgeries: Ankle surgery, knee surgery, and 2 C-sections.  Reports allergies to codeine, Zofran, penicillin  Family history: Denies any psychiatric family history.  Denies any suicide attempts in the family.  Substance use history: Patient reports last drink was 18 years ago.  Reports at that time she was drinking 1 bottle of wine per day, on the boat, on the weekends, only during the summer.  Denies any other nicotine or tobacco use.  Denies any marijuana use.  Denies any other illicit drug use.  Attempted to call significant other [phone] at 2:52pm on 09-03-2021, no answer.    Risk to Self:   Denies  Risk to Others: Denies Prior Inpatient Therapy:  Denies Prior Outpatient Therapy:  Denies currently  Past Medical History:  Past Medical History:  Diagnosis Date   Depression    ETOH abuse    Hemophilia (HCC)    History reviewed. No pertinent surgical history. Family History: History reviewed. No pertinent family history.  Social History:  Social History   Substance and Sexual Activity  Alcohol Use Yes  Social History   Substance and Sexual Activity  Drug Use Not on file    Social History   Socioeconomic History   Marital status: Divorced    Spouse name: Not on file   Number of children: Not on file   Years of education: Not on file   Highest education level: Not on file  Occupational History   Not  on file  Tobacco Use   Smoking status: Former    Types: Cigarettes   Smokeless tobacco: Never  Substance and Sexual Activity   Alcohol use: Yes   Drug use: Not on file   Sexual activity: Not on file  Other Topics Concern   Not on file  Social History Narrative   Not on file   Social Determinants of Health   Financial Resource Strain: Not on file  Food Insecurity: Not on file  Transportation Needs: Not on file  Physical Activity: Not on file  Stress: Not on file  Social Connections: Not on file   Additional Social History:    Allergies:   Allergies  Allergen Reactions   Codeine Shortness Of Breath    Tongue Swelling   Keflex [Cephalexin]     Tongue Swelling   Penicillins     Did it involve swelling of the face/tongue/throat, SOB, or low BP? Yes Did it involve sudden or severe rash/hives, skin peeling, or any reaction on the inside of your mouth or nose? Yes Did you need to seek medical attention at a hospital or doctor's office? Yes When did it last happen?   Over 20 Years Ago    If all above answers are "NO", may proceed with cephalosporin use.      Labs:  Results for orders placed or performed during the hospital encounter of 09/02/21 (from the past 48 hour(s))  Comprehensive metabolic panel     Status: Abnormal   Collection Time: 09/03/21  6:01 AM  Result Value Ref Range   Sodium 138 135 - 145 mmol/L   Potassium 3.0 (L) 3.5 - 5.1 mmol/L   Chloride 105 98 - 111 mmol/L   CO2 22 22 - 32 mmol/L   Glucose, Bld 114 (H) 70 - 99 mg/dL    Comment: Glucose reference range applies only to samples taken after fasting for at least 8 hours.   BUN 7 6 - 20 mg/dL   Creatinine, Ser 3.26 0.44 - 1.00 mg/dL   Calcium 9.4 8.9 - 71.2 mg/dL   Total Protein 6.9 6.5 - 8.1 g/dL   Albumin 4.0 3.5 - 5.0 g/dL   AST 56 (H) 15 - 41 U/L   ALT 35 0 - 44 U/L   Alkaline Phosphatase 70 38 - 126 U/L   Total Bilirubin 1.7 (H) 0.3 - 1.2 mg/dL   GFR, Estimated >45 >80 mL/min    Comment:  (NOTE) Calculated using the CKD-EPI Creatinine Equation (2021)    Anion gap 11 5 - 15    Comment: Performed at [facility], [address], [place], [place] [address]  CBC     Status: Abnormal   Collection Time: 09/03/21  6:01 AM  Result Value Ref Range   WBC 7.7 4.0 - 10.5 K/uL   RBC 3.84 (L) 3.87 - 5.11 MIL/uL   Hemoglobin 13.9 12.0 - 15.0 g/dL   HCT 82.5 05.3 - 97.6 %   MCV 104.9 (H) 80.0 - 100.0 fL   MCH 36.2 (H) 26.0 - 34.0 pg   MCHC 34.5 30.0 - 36.0 g/dL  RDW 12.7 11.5 - 15.5 %   Platelets 181 150 - 400 K/uL   nRBC 0.0 0.0 - 0.2 %    Comment: Performed at [facility], [address] [place], [place] [address]  Glucose, capillary     Status: Abnormal   Collection Time: 09/03/21  8:18 AM  Result Value Ref Range   Glucose-Capillary 118 (H) 70 - 99 mg/dL    Comment: Glucose reference range applies only to samples taken after fasting for at least 8 hours.  Glucose, capillary     Status: Abnormal   Collection Time: 09/03/21 12:53 PM  Result Value Ref Range   Glucose-Capillary 120 (H) 70 - 99 mg/dL    Comment: Glucose reference range applies only to samples taken after fasting for at least 8 hours.  Potassium     Status: Abnormal   Collection Time: 09/03/21  1:32 PM  Result Value Ref Range   Potassium 3.3 (L) 3.5 - 5.1 mmol/L    Comment: Performed at [facility], [address] [place], [place] [address]  Rapid urine drug screen (hospital performed)     Status: Abnormal   Collection Time: 09/03/21  4:08 PM  Result Value Ref Range   Opiates NONE DETECTED NONE DETECTED   Cocaine NONE DETECTED NONE DETECTED   Benzodiazepines POSITIVE (A) NONE DETECTED   Amphetamines NONE DETECTED NONE DETECTED   Tetrahydrocannabinol POSITIVE (A) NONE DETECTED   Barbiturates NONE DETECTED NONE DETECTED    Comment: (NOTE) DRUG SCREEN FOR MEDICAL PURPOSES ONLY.  IF CONFIRMATION IS NEEDED FOR ANY PURPOSE, NOTIFY LAB WITHIN 5  DAYS.  LOWEST DETECTABLE LIMITS FOR URINE DRUG SCREEN Drug Class                     Cutoff (ng/mL) Amphetamine and metabolites    1000 Barbiturate and metabolites    200 Benzodiazepine                 200 Tricyclics and metabolites     300 Opiates and metabolites        300 Cocaine and metabolites        300 THC                            50 Performed at [facility], [address], [place], [place] [address]   Glucose, capillary     Status: Abnormal   Collection Time: 09/03/21  4:42 PM  Result Value Ref Range   Glucose-Capillary 119 (H) 70 - 99 mg/dL    Comment: Glucose reference range applies only to samples taken after fasting for at least 8 hours.  Glucose, capillary     Status: Abnormal   Collection Time: 09/03/21  9:26 PM  Result Value Ref Range   Glucose-Capillary 111 (H) 70 - 99 mg/dL    Comment: Glucose reference range applies only to samples taken after fasting for at least 8 hours.  CBC     Status: Abnormal   Collection Time: 09/04/21  4:49 AM  Result Value Ref Range   WBC 6.3 4.0 - 10.5 K/uL   RBC 3.91 3.87 - 5.11 MIL/uL   Hemoglobin 14.5 12.0 - 15.0 g/dL   HCT 28.4 13.2 - 44.0 %   MCV 105.4 (H) 80.0 - 100.0 fL   MCH 37.1 (H) 26.0 - 34.0 pg   MCHC 35.2 30.0 - 36.0 g/dL   RDW 10.2 72.5 -  15.5 %   Platelets 210 150 - 400 K/uL   nRBC 0.0 0.0 - 0.2 %    Comment: Performed at [facility], [address] [place], [place] [address]  Comprehensive metabolic panel     Status: Abnormal   Collection Time: 09/04/21  4:49 AM  Result Value Ref Range   Sodium 139 135 - 145 mmol/L   Potassium 3.3 (L) 3.5 - 5.1 mmol/L   Chloride 108 98 - 111 mmol/L   CO2 23 22 - 32 mmol/L   Glucose, Bld 107 (H) 70 - 99 mg/dL    Comment: Glucose reference range applies only to samples taken after fasting for at least 8 hours.   BUN 7 6 - 20 mg/dL   Creatinine, Ser 7.82 0.44 - 1.00 mg/dL   Calcium 9.4 8.9 - 95.6 mg/dL   Total Protein 6.6 6.5 - 8.1  g/dL   Albumin 3.7 3.5 - 5.0 g/dL   AST 47 (H) 15 - 41 U/L   ALT 34 0 - 44 U/L   Alkaline Phosphatase 73 38 - 126 U/L   Total Bilirubin 1.2 0.3 - 1.2 mg/dL   GFR, Estimated >21 >30 mL/min    Comment: (NOTE) Calculated using the CKD-EPI Creatinine Equation (2021)    Anion gap 8 5 - 15    Comment: Performed at [facility], [address] [place], [place] [address]  Magnesium     Status: None   Collection Time: 09/04/21  4:49 AM  Result Value Ref Range   Magnesium 1.9 1.7 - 2.4 mg/dL    Comment: Performed at [facility], [address], [place], [place] [address]  Glucose, capillary     Status: Abnormal   Collection Time: 09/04/21  7:15 AM  Result Value Ref Range   Glucose-Capillary 108 (H) 70 - 99 mg/dL    Comment: Glucose reference range applies only to samples taken after fasting for at least 8 hours.  Glucose, capillary     Status: None   Collection Time: 09/04/21 11:26 AM  Result Value Ref Range   Glucose-Capillary 95 70 - 99 mg/dL    Comment: Glucose reference range applies only to samples taken after fasting for at least 8 hours.    Current Facility-Administered Medications  Medication Dose Route Frequency Provider Last Rate Last Admin   acetaminophen (TYLENOL) tablet 650 mg  650 mg Oral Q6H PRN Amin, Ankit Chirag, MD       chlordiazePOXIDE (LIBRIUM) capsule 25 mg  25 mg Oral BID Amin, Ankit Chirag, MD   25 mg at 09/04/21 0851   Followed by   Melene Muller ON 09/05/2021] chlordiazePOXIDE (LIBRIUM) capsule 25 mg  25 mg Oral Q1500 Amin, Ankit Chirag, MD       folic acid (FOLVITE) tablet 1 mg  1 mg Oral Daily Kyle, Tyrone A, DO   1 mg at 09/04/21 0850   guaiFENesin (ROBITUSSIN) 100 MG/5ML liquid 5 mL  5 mL Oral Q4H PRN Amin, Ankit Chirag, MD       haloperidol lactate (HALDOL) injection 5 mg  5 mg Intravenous Q6H PRN Amin, Ankit Chirag, MD       hydrALAZINE (APRESOLINE) injection 10 mg  10 mg Intravenous Q4H PRN Amin, Ankit Chirag, MD   10 mg at  09/04/21 1348   insulin aspart (novoLOG) injection 0-9 Units  0-9 Units Subcutaneous TID WC Amin, Ankit Chirag, MD       ipratropium-albuterol (DUONEB) 0.5-2.5 (3) MG/3ML nebulizer solution 3  mL  3 mL Nebulization Q4H PRN Amin, Ankit Chirag, MD       LORazepam (ATIVAN) injection 0-4 mg  0-4 mg Intravenous Q12H Kyle, Tyrone A, DO       Or   LORazepam (ATIVAN) tablet 0-4 mg  0-4 mg Oral Q12H Kyle, Tyrone A, DO       LORazepam (ATIVAN) tablet 1-4 mg  1-4 mg Oral Q1H PRN Ronaldo Miyamoto, Tyrone A, DO       Or   LORazepam (ATIVAN) injection 1-4 mg  1-4 mg Intravenous Q1H PRN Ronaldo Miyamoto, Tyrone A, DO   2 mg at 09/03/21 0543   multivitamin with minerals tablet 1 tablet  1 tablet Oral Daily Kyle, Tyrone A, DO   1 tablet at 09/04/21 0850   oxyCODONE (Oxy IR/ROXICODONE) immediate release tablet 5 mg  5 mg Oral Q4H PRN Amin, Ankit Chirag, MD       pantoprazole (PROTONIX) EC tablet 40 mg  40 mg Oral Daily Amin, Ankit Chirag, MD   40 mg at 09/04/21 0850   potassium chloride 10 mEq in 100 mL IVPB  10 mEq Intravenous Q1 Hr x 4 Amin, Ankit Chirag, MD 100 mL/hr at 09/04/21 1344 10 mEq at 09/04/21 1344   prochlorperazine (COMPAZINE) injection 10 mg  10 mg Intravenous Q6H PRN Ronaldo Miyamoto, Tyrone A, DO   10 mg at 09/02/21 1708   senna-docusate (Senokot-S) tablet 1 tablet  1 tablet Oral QHS PRN Amin, Ankit Chirag, MD       thiamine tablet 100 mg  100 mg Oral Daily Kyle, Tyrone A, DO   100 mg at 09/04/21 4098   Or   thiamine (B-1) injection 100 mg  100 mg Intravenous Daily Kyle, Tyrone A, DO   100 mg at 09/03/21 1191   vitamin B-12 (CYANOCOBALAMIN) tablet 1,000 mcg  1,000 mcg Oral Daily Dimple Nanas, MD   1,000 mcg at 09/04/21 0850    Musculoskeletal: Strength & Muscle Tone: Laying in bed   Gait & Station: Laying in bed   Patient leans: Laying in bed             Psychiatric Specialty Exam:  Presentation  General Appearance: Appropriate for Environment; Casual  Eye Contact:Fair  Speech:Clear and Coherent; Normal  Rate  Speech Volume:Normal  Handedness:Right  Mood and Affect  Mood:Anxious  Affect:Appropriate; Congruent   Thought Process  Thought Processes:Coherent; Linear  Descriptions of Associations:Intact  Orientation:Full (Time, Place and Person)  Thought Content:Logical  History of Schizophrenia/Schizoaffective disorder:No data recorded Duration of Psychotic Symptoms:No data recorded Hallucinations:Hallucinations: None Description of Visual Hallucinations: rabbits  Ideas of Reference:None  Suicidal Thoughts:Suicidal Thoughts: No  Homicidal Thoughts:Homicidal Thoughts: No   Sensorium  Memory:Immediate Fair; Recent Fair; Remote Poor  Judgment:Fair  Insight:Fair   Executive Functions  Concentration:Fair  Attention Span:Fair  Recall:Fair Fund of Knowledge:Fair Language:Fair  Psychomotor Activity  Psychomotor Activity:Psychomotor Activity: Normal   Assets  Assets:Communication Skills; Desire for Improvement; Leisure Time; Physical Health; Resilience  Sleep  Sleep:Sleep: Poor   Physical Exam: Physical Exam Vitals and nursing note reviewed.  Constitutional:      Appearance: Normal appearance. She is normal weight.  Pulmonary:     Effort: Pulmonary effort is normal.  Skin:    Capillary Refill: Capillary refill takes less than 2 seconds.  Neurological:     General: No focal deficit present.     Mental Status: She is alert and oriented to person, place, and time. Mental status is at baseline.  Psychiatric:  Mood and Affect: Mood normal.        Behavior: Behavior normal.        Thought Content: Thought content normal.        Judgment: Judgment normal.    Review of Systems  Psychiatric/Behavioral:  Positive for substance abuse. Negative for hallucinations. Suicidal ideas: denies.The patient is nervous/anxious and has insomnia.   All other systems reviewed and are negative.  Blood pressure (!) 182/92, pulse 71, temperature 98.1 F (36.7 C),  temperature source Oral, resp. rate 18, height 5' (1.524 m), weight 49.4 kg, SpO2 98 %. Body mass index is 21.29 kg/m.  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  --Continue alcohol withdrawal management by primary team -Consider high dose IV thiamine  -TOC consult for referral to [facility](all women groups and programs).  Psych cleared at this time, patient with improvement and return to baseline mental status. She denies any suicide attempt and is low risk for suicide completion at this time.   Disposition: Psychiatric consult liaison team will sign off at this time.   Maryagnes Amos, FNP 09/04/2021 2:20 PM  Total Time Spent in Direct Patient Care:  I personally spent 25 minutes on the unit in direct patient care. The direct patient care time included face-to-face time with the patient, reviewing the patient's chart, communicating with other professionals, and coordinating care. Greater than 50% of this time was spent in counseling or coordinating care with the patient regarding goals of hospitalization, psycho-education, and discharge planning needs.

## 2021-09-04 NOTE — Progress Notes (Signed)
PROGRESS NOTE    Terri Maxwell  SHF:[id number] DOB: January 03, 1966 DOA: 09/02/2021 PCP: System, Provider Not In   Brief Narrative:  56 year old with history of depression, hemophilia, alcohol abuse, ADHD hospital for agitation.  Upon admission history was mostly per her daughter who stated patient stopped drinking about 4 days prior to admission.  She was admitted to the hospital for signs and symptoms of severe agitation from alcohol withdrawal and likely encephalopathy from Ambien overdose.  Psychiatry team has been consulted.   Assessment & Plan:  Principal Problem:   Alcohol withdrawal (HCC) Active Problems:   Alcohol abuse   Depression   Acute metabolic encephalopathy   Overdose   Hypokalemia   Hypercalcemia   Hyperglycemia   HTN (hypertension)    Alcohol abuse with signs of withdrawal Agitation - Currently patient is on alcohol withdrawal protocol and restraints as well - Multivitamin, folic acid and thiamine.  Currently on Librium taper - IV fluids. PPI daily  Acute metabolic encephalopathy secondary to Ambien overdose -Drowsy as she received sedative earlier due to anxiety -UDS-positive for benzodiazepine and THC - EKG shows normal sinus rhythm with normal QTc - Alcohol, acetaminophen and salicylate levels are negative  Hypokalemia/hypomagnesemia -Continue aggressive repletion as necessary  Hypercalcemia -Admission calcium 10.5 likely from dehydration.  Resolved this morning.  Hyperglycemia -Hemoglobin A1c 5.9.  Continue sliding scale and Accu-Cheks.  Macrocytosis - Suspect from alcohol use.  Folate is normal.  Borderline low B12 therefore will start repletion  Vitamin B12 deficiency - Vitamin B12 supplements   Elevated blood pressure - No prior history of this.  Likely from alcohol withdrawal.  We will manage this with as needed medications  Depression - Not on any home meds   DVT prophylaxis: SCDs Start: 09/02/21 1059 Code Status: Full code Family  Communication:  Called her daughter- updated.   Status is: Inpatient Remains inpatient appropriate because: Pending improvement in mentation so psychiatry can thoroughly evaluate her and make further plan. In the mean time we will continue to monitor sign and symptoms of eoth withdrawal  Subjective:  Awake this morning but still tremulous. Her story keeps on changing with intermittent confusion.   Examination: Constitutional: Not in acute distress. Slightly tremulous.  Respiratory: Clear to auscultation bilaterally Cardiovascular: Normal sinus rhythm, no rubs Abdomen: Nontender nondistended good bowel sounds Musculoskeletal: No edema noted Skin: No rashes seen Neurologic: CN 2-12 grossly intact.  And nonfocal Psychiatric:  Poor judgment and insight. Alert and oriented x 3. Normal mood.     Objective: Vitals:   09/03/21 1327 09/03/21 1958 09/04/21 0425 09/04/21 0644  BP: (!) 126/92 (!) 154/92 (!) 179/93 139/83  Pulse: 96 88 77 74  Resp: 20 20 19    Temp: 98.2 F (36.8 C) 97.9 F (36.6 C) 98.2 F (36.8 C)   TempSrc: Oral Oral Oral   SpO2: 96% 94% 95%   Weight:      Height:        Intake/Output Summary (Last 24 hours) at 09/04/2021 0741 Last data filed at 09/04/2021 11/04/2021 Gross per 24 hour  Intake 1384.77 ml  Output 2850 ml  Net -1465.23 ml   Filed Weights   09/02/21 0108  Weight: 49.4 kg     Data Reviewed:   CBC: Recent Labs  Lab 09/02/21 0245 09/03/21 0601 09/04/21 0449  WBC 8.5 7.7 6.3  HGB 16.4* 13.9 14.5  HCT 46.6* 40.3 41.2  MCV 104.0* 104.9* 105.4*  PLT 210 181 210   Basic Metabolic Panel: Recent Labs  Lab  09/02/21 0245 09/02/21 1104 09/03/21 0601 09/03/21 1332 09/04/21 0449  NA 139 136 138  --  139  K 3.1* 2.9* 3.0* 3.3* 3.3*  CL 100 102 105  --  108  CO2 28 25 22   --  23  GLUCOSE 145* 124* 114*  --  107*  BUN 9 8 7   --  7  CREATININE 0.72 0.52 0.58  --  0.58  CALCIUM 10.5* 9.4 9.4  --  9.4  MG  --  2.0  --   --  1.9  PHOS  --  5.1*   4.8*  --   --   --    GFR: Estimated Creatinine Clearance: 58.4 mL/min (by C-G formula based on SCr of 0.58 mg/dL). Liver Function Tests: Recent Labs  Lab 09/02/21 0245 09/02/21 1104 09/03/21 0601 09/04/21 0449  AST 38  --  56* 47*  ALT 36  --  35 34  ALKPHOS 81  --  70 73  BILITOT 1.3*  --  1.7* 1.2  PROT 7.7  --  6.9 6.6  ALBUMIN 4.6 4.1 4.0 3.7   No results for input(s): "LIPASE", "AMYLASE" in the last 168 hours. No results for input(s): "AMMONIA" in the last 168 hours. Coagulation Profile: No results for input(s): "INR", "PROTIME" in the last 168 hours. Cardiac Enzymes: No results for input(s): "CKTOTAL", "CKMB", "CKMBINDEX", "TROPONINI" in the last 168 hours. BNP (last 3 results) No results for input(s): "PROBNP" in the last 8760 hours. HbA1C: Recent Labs    09/02/21 1104  HGBA1C 5.9*   CBG: Recent Labs  Lab 09/03/21 0818 09/03/21 1253 09/03/21 1642 09/03/21 2126 09/04/21 0715  GLUCAP 118* 120* 119* 111* 108*   Lipid Profile: No results for input(s): "CHOL", "HDL", "LDLCALC", "TRIG", "CHOLHDL", "LDLDIRECT" in the last 72 hours. Thyroid Function Tests: No results for input(s): "TSH", "T4TOTAL", "FREET4", "T3FREE", "THYROIDAB" in the last 72 hours. Anemia Panel: Recent Labs    09/02/21 1104  VITAMINB12 161*  FOLATE 11.8   Sepsis Labs: Recent Labs  Lab 09/02/21 0246  LATICACIDVEN 1.3    No results found for this or any previous visit (from the past 240 hour(s)).       Radiology Studies: No results found.      Scheduled Meds:  chlordiazePOXIDE  25 mg Oral TID   Followed by   chlordiazePOXIDE  25 mg Oral BID   Followed by   11/02/21 ON 09/05/2021] chlordiazePOXIDE  25 mg Oral Q1500   folic acid  1 mg Oral Daily   insulin aspart  0-9 Units Subcutaneous TID WC   LORazepam  0-4 mg Intravenous Q12H   Or   LORazepam  0-4 mg Oral Q12H   multivitamin with minerals  1 tablet Oral Daily   pantoprazole  40 mg Oral Daily   thiamine  100 mg Oral  Daily   Or   thiamine  100 mg Intravenous Daily   vitamin B-12  1,000 mcg Oral Daily   Continuous Infusions:  sodium chloride 75 mL/hr at 09/03/21 2156   potassium chloride       LOS: 2 days   Time spent= 35 mins    Jassmin Kemmerer 11/03/21, MD [facility]  If 7PM-7AM, please contact night-coverage  09/04/2021, 7:41 AM

## 2021-09-04 NOTE — Progress Notes (Signed)
Initial Nutrition Assessment  DOCUMENTATION CODES:   Not applicable  INTERVENTION:  - will order Boost Breeze once/day, each supplement provides 250 kcal and 9 grams of protein. - will order Boost Plus once/day, each supplement provides 360 kcal and 14 grams of protein. - complete NFPE when feasible.    NUTRITION DIAGNOSIS:   Increased nutrient needs related to acute illness as evidenced by estimated needs.  GOAL:   Patient will meet greater than or equal to 90% of their needs  MONITOR:   PO intake, Supplement acceptance, Labs, Weight trends  REASON FOR ASSESSMENT:   Malnutrition Screening Tool  ASSESSMENT:   56 year old female with medical history depression, hemophilia, alcohol abuse, and ADHD. Her daughter reported that patient stopped drinking 4 days PTA. She was admitted d/t severe agitation and alcohol withdrawal.  Tech providing care for patient at time of first attempted visit earlier this afternoon. Patient currently sleeping soundly with no visitors present.   She was previously noted to be a/o to self and place only but Psychiatry NP note from a short time ago indicates patient is now a/o x4.   No meal intakes documented this hospitalization.   Weight yesterday was 109 lb and no other weights documented in the chart. No information documented in the edema section of flow sheet.    Labs reviewed; CBGs: 108 and 95 mg/dl, K: 3.3 mmol/l.  Medications reviewed; 1 mg folvite/day, sliding scale novolog, 1 tablet multivitamin with minerals/day, 40 mg oral protonix/day, 10 mEq IV KCl x4 runs 6/11 and x4 runs 6/12, 100 mg thiamine/day, 1000 mcg oral cyanocobalamin/day.     NUTRITION - FOCUSED PHYSICAL EXAM:  Unable at this time.   Diet Order:   Diet Order             Diet Heart Room service appropriate? Yes; Fluid consistency: Thin  Diet effective now                   EDUCATION NEEDS:   No education needs have been identified at this time  Skin:   Skin Assessment: Reviewed RN Assessment  Last BM:  6/12 (type 7 x3, one large amount and two medium amounts)  Height:   Ht Readings from Last 1 Encounters:  09/02/21 5' (1.524 m)    Weight:   Wt Readings from Last 1 Encounters:  09/02/21 49.4 kg     BMI:  Body mass index is 21.29 kg/m.  Estimated Nutritional Needs:  Kcal:  1600-1900 kcal Protein:  80-95 grams Fluid:  >/= 1.8 L/day     Jarome Matin, MS, RD, LDN Registered Dietitian II Inpatient Clinical Nutrition RD pager # and on-call/weekend pager # available in [facility]

## 2021-09-04 NOTE — Plan of Care (Signed)
  Problem: Activity: Goal: Risk for activity intolerance will decrease Outcome: Progressing   Problem: Nutrition: Goal: Adequate nutrition will be maintained Outcome: Progressing   Problem: Elimination: Goal: Will not experience complications related to bowel motility Outcome: Progressing Goal: Will not experience complications related to urinary retention Outcome: Progressing   

## 2021-09-04 NOTE — TOC Progression Note (Signed)
Transition of Care [facility]) - Progression Note    Patient Details  Name: Terri Maxwell MRN: [id number] Date of Birth: 05-11-1965  Transition of Care [facility]) CM/SW Contact  Cylas Falzone, Olegario Messier, RN Phone Number: 09/04/2021, 11:47 AM  Clinical Narrative: Psych following. Monitor for d/c plans.      Expected Discharge Plan: Home/Self Care Barriers to Discharge: Continued Medical Work up  Expected Discharge Plan and Services Expected Discharge Plan: Home/Self Care   Discharge Planning Services: CM Consult   Living arrangements for the past 2 months: Single Family Home                                       Social Determinants of Health (SDOH) Interventions    Readmission Risk Interventions     No data to display

## 2021-09-05 DIAGNOSIS — F32A Depression, unspecified: Secondary | ICD-10-CM | POA: Diagnosis not present

## 2021-09-05 DIAGNOSIS — G9341 Metabolic encephalopathy: Secondary | ICD-10-CM | POA: Diagnosis not present

## 2021-09-05 DIAGNOSIS — F10931 Alcohol use, unspecified with withdrawal delirium: Secondary | ICD-10-CM | POA: Diagnosis not present

## 2021-09-05 DIAGNOSIS — F1010 Alcohol abuse, uncomplicated: Secondary | ICD-10-CM | POA: Diagnosis not present

## 2021-09-05 LAB — BASIC METABOLIC PANEL
Anion gap: 8 (ref 5–15)
BUN: 10 mg/dL (ref 6–20)
CO2: 22 mmol/L (ref 22–32)
Calcium: 9.1 mg/dL (ref 8.9–10.3)
Chloride: 106 mmol/L (ref 98–111)
Creatinine, Ser: 0.62 mg/dL (ref 0.44–1.00)
GFR, Estimated: >60 mL/min (ref >60)
Glucose, Bld: 116 mg/dL — ABNORMAL HIGH (ref 70–99)
Potassium: 3.5 mmol/L (ref 3.5–5.1)
Sodium: 136 mmol/L (ref 135–145)

## 2021-09-05 LAB — CBC
HCT: 42.8 % (ref 36.0–46.0)
Hemoglobin: 14.8 g/dL (ref 12.0–15.0)
MCH: 36.5 pg — ABNORMAL HIGH (ref 26.0–34.0)
MCHC: 34.6 g/dL (ref 30.0–36.0)
MCV: 105.7 fL — ABNORMAL HIGH (ref 80.0–100.0)
Platelets: 235 10*3/uL (ref 150–400)
RBC: 4.05 MIL/uL (ref 3.87–5.11)
RDW: 12.9 % (ref 11.5–15.5)
WBC: 8 10*3/uL (ref 4.0–10.5)
nRBC: 0 % (ref 0.0–0.2)

## 2021-09-05 LAB — GLUCOSE, CAPILLARY
Glucose-Capillary: 114 mg/dL — ABNORMAL HIGH (ref 70–99)
Glucose-Capillary: 153 mg/dL — ABNORMAL HIGH (ref 70–99)

## 2021-09-05 LAB — MAGNESIUM: Magnesium: 2.1 mg/dL (ref 1.7–2.4)

## 2021-09-05 MED ORDER — FOLIC ACID 1 MG PO TABS: 1.0000 mg | ORAL_TABLET | Freq: Every day | ORAL | 0 refills | Status: AC

## 2021-09-05 MED ORDER — THIAMINE HCL 100 MG PO TABS: 100.0000 mg | ORAL_TABLET | Freq: Every day | ORAL | 0 refills | Status: AC

## 2021-09-05 MED ORDER — PANTOPRAZOLE SODIUM 40 MG PO TBEC: 40.0000 mg | DELAYED_RELEASE_TABLET | Freq: Every day | ORAL | 0 refills | Status: DC

## 2021-09-05 MED ORDER — CYANOCOBALAMIN 1000 MCG PO TABS: 1000.0000 ug | ORAL_TABLET | Freq: Every day | ORAL | 0 refills | Status: AC

## 2021-09-05 NOTE — Progress Notes (Signed)
RN provided and explained discharge instructions. IV removed. Tele removed. Pt dressed in personal clothing and taken to the main entrance via wheelchair.

## 2021-09-05 NOTE — TOC Transition Note (Signed)
Transition of Care [facility]) - CM/SW Discharge Note   Patient Details  Name: Terri Maxwell MRN: [id number] Date of Birth: 07/16/1965  Transition of Care [facility]) CM/SW Contact:  Lanier Clam, RN Phone Number: 09/05/2021, 10:14 AM   Clinical Narrative: Psych-[facility]-Placed on d/c instructions sheet for patient to contact [facility] for intake process. No further CM needs.      Final next level of care: Home/Self Care Barriers to Discharge: No Barriers Identified   Patient Goals and CMS Choice Patient states their goals for this hospitalization and ongoing recovery are:: Home CMS Medicare.gov Compare Post Acute Care list provided to:: Patient Choice offered to / list presented to : Patient  Discharge Placement                       Discharge Plan and Services   Discharge Planning Services: CM Consult                                 Social Determinants of Health (SDOH) Interventions     Readmission Risk Interventions     No data to display

## 2021-09-05 NOTE — Discharge Summary (Signed)
Physician Discharge Summary  Jaclin Finks ZOX:[id number] DOB: [birth year] DOA: 09/02/2021  PCP: System, Provider Not In  Admit date: 09/02/2021 Discharge date: 09/05/2021  Admitted From: Home Disposition: Home  Recommendations for Outpatient Follow-up:  Follow up with PCP in 1-2 weeks Please obtain BMP/CBC in one week your next doctors visit.  Outpatient referral for alcohol rehab has been given PPI daily.  Folic acid, multivitamin, thiamine B12 supplements  Discharge Condition: Stable CODE STATUS: Full code Diet recommendation: Heart healthy  Brief/Interim Summary: 56 year old with history of depression, hemophilia, alcohol abuse, ADHD hospital for agitation.  Upon admission history was mostly per her daughter who stated patient stopped drinking about 4 days prior to admission.  She was admitted to the hospital for signs and symptoms of severe agitation from alcohol withdrawal and likely encephalopathy from Ambien overdose.  Psychiatry team has been consulted.  Patient was cleared for discharge from psychiatry standpoint.  Over 48 hours she did well and no longer started having any signs of withdrawal.  She is stable for discharge today with recommendations as stated above.     Assessment & Plan:  Principal Problem:   Alcohol withdrawal (HCC) Active Problems:   Alcohol abuse   Depression   Acute metabolic encephalopathy   Overdose   Hypokalemia   Hypercalcemia   Hyperglycemia   HTN (hypertension)     Alcohol abuse with signs of withdrawal Agitation - No longer agitated or actively withdrawing from alcohol.  Librium taper completed today.  We will discharge her home on folic acid, thiamine and multivitamin.  She has been given resources to help with alcohol outside.  I also prescribed a PPI.   Acute metabolic encephalopathy secondary to Ambien overdose - Resolved.  Seen by psychiatry-cleared for discharge -UDS-positive for benzodiazepine and THC - EKG shows normal sinus  rhythm with normal QTc - Alcohol, acetaminophen and salicylate levels are negative   Hypokalemia/hypomagnesemia - Repleted during hospitalization   Hypercalcemia -Admission calcium 10.5 likely from dehydration.  Resolved this morning.   Hyperglycemia -Hemoglobin A1c 5.9.  Diet control outpatient   Macrocytosis - Suspect from alcohol use.  Folate is normal.  Borderline low B12 therefore will start repletion   Vitamin B12 deficiency - Vitamin B12 supplements, prescribed upon discharge   Elevated blood pressure - Improved   Depression - Not on any home meds       Assessment and Plan: No notes have been filed under this hospital service. Service: Hospitalist      Body mass index is 21.29 kg/m.       Discharge Diagnoses:  Principal Problem:   Alcohol withdrawal (HCC) Active Problems:   Alcohol abuse   Depression   Acute metabolic encephalopathy   Overdose   Hypokalemia   Hypercalcemia   Hyperglycemia   HTN (hypertension)      Consultations: Psychiatry  Subjective: Feels well no complaints.  No longer showing any signs of withdrawal.  Discharge Exam: Vitals:   09/04/21 2031 09/05/21 0850  BP: (!) 164/84 117/65  Pulse: 71 76  Resp: 18 14  Temp: 98 F (36.7 C) 98 F (36.7 C)  SpO2: 93% 97%   Vitals:   09/04/21 1232 09/04/21 1731 09/04/21 2031 09/05/21 0850  BP: (!) 182/92 (!) 146/100 (!) 164/84 117/65  Pulse:  85 71 76  Resp:  Temp:   98 F (36.7 C) 98 F (36.7 C)  TempSrc:   Oral Oral  SpO2:   93% 97%  Weight:  Height:        General: Pt is alert, awake, not in acute distress Cardiovascular: RRR, S1/S2 +, no rubs, no gallops Respiratory: CTA bilaterally, no wheezing, no rhonchi Abdominal: Soft, NT, ND, bowel sounds + Extremities: no edema, no cyanosis  Discharge Instructions   Allergies as of 09/05/2021       Reactions   Codeine Shortness Of Breath   Tongue Swelling   Keflex [cephalexin]    Tongue Swelling    Penicillins    Did it involve swelling of the face/tongue/throat, SOB, or low BP? Yes Did it involve sudden or severe rash/hives, skin peeling, or any reaction on the inside of your mouth or nose? Yes Did you need to seek medical attention at a hospital or doctor's office? Yes When did it last happen?   Over 20 Years Ago    If all above answers are "NO", may proceed with cephalosporin use.        Medication List     TAKE these medications    cyanocobalamin 1000 MCG tablet Take 1 tablet (1,000 mcg total) by mouth daily. Start taking on: September 06, 2021   folic acid 1 MG tablet Commonly known as: FOLVITE Take 1 tablet (1 mg total) by mouth daily. Start taking on: September 06, 2021   methylphenidate 54 MG CR tablet Commonly known as: CONCERTA Take 54 mg by mouth every morning.   pantoprazole 40 MG tablet Commonly known as: PROTONIX Take 1 tablet (40 mg total) by mouth daily before breakfast.   thiamine 100 MG tablet Take 1 tablet (100 mg total) by mouth daily. Start taking on: September 06, 2021   zolpidem 12.5 MG CR tablet Commonly known as: AMBIEN CR Take 12.5 mg by mouth at bedtime as needed for sleep.        Follow-up Information     Joycelyn Rua, MD. Schedule an appointment as soon as possible for a visit in 1 week(s).   Specialty: Family Medicine Contact information: [address] [place] [address] [phone]         [facility] Schedule an appointment as soon as possible for a visit.   Contact information: [address] [place] [address] [phone]                Allergies  Allergen Reactions   Codeine Shortness Of Breath    Tongue Swelling   Keflex [Cephalexin]     Tongue Swelling   Penicillins     Did it involve swelling of the face/tongue/throat, SOB, or low BP? Yes Did it involve sudden or severe rash/hives, skin peeling, or any reaction on the inside of your mouth or nose? Yes Did you need to seek  medical attention at a hospital or doctor's office? Yes When did it last happen?   Over 20 Years Ago    If all above answers are "NO", may proceed with cephalosporin use.      You were cared for by a hospitalist during your hospital stay. If you have any questions about your discharge medications or the care you received while you were in the hospital after you are discharged, you can call the unit and asked to speak with the hospitalist on call if the hospitalist that took care of you is not available. Once you are discharged, your primary care physician will handle any further medical issues. Please note that no refills for any discharge medications will be authorized once you are discharged, as it  is imperative that you return to your primary care physician (or establish a relationship with a primary care physician if you do not have one) for your aftercare needs so that they can reassess your need for medications and monitor your lab values.   Procedures/Studies: No results found.   The results of significant diagnostics from this hospitalization (including imaging, microbiology, ancillary and laboratory) are listed below for reference.     Microbiology: No results found for this or any previous visit (from the past 240 hour(s)).   Labs: BNP (last 3 results) No results for input(s): "BNP" in the last 8760 hours. Basic Metabolic Panel: Recent Labs  Lab 09/02/21 0245 09/02/21 1104 09/03/21 0601 09/03/21 1332 09/04/21 0449 09/05/21 0437  NA 139 136 138  --  139 136  K 3.1* 2.9* 3.0* 3.3* 3.3* 3.5  CL 100 102 105  --  108 106  CO2 28 25 22   --  23 22  GLUCOSE 145* 124* 114*  --  107* 116*  BUN 9 8 7   --  7 10  CREATININE 0.72 0.52 0.58  --  0.58 0.62  CALCIUM 10.5* 9.4 9.4  --  9.4 9.1  MG  --  2.0  --   --  1.9 2.1  PHOS  --  5.1*  4.8*  --   --   --   --    Liver Function Tests: Recent Labs  Lab 09/02/21 0245 09/02/21 1104 09/03/21 0601 09/04/21 0449  AST 38  --   56* 47*  ALT 36  --  35 34  ALKPHOS 81  --  70 73  BILITOT 1.3*  --  1.7* 1.2  PROT 7.7  --  6.9 6.6  ALBUMIN 4.6 4.1 4.0 3.7   No results for input(s): "LIPASE", "AMYLASE" in the last 168 hours. No results for input(s): "AMMONIA" in the last 168 hours. CBC: Recent Labs  Lab 09/02/21 0245 09/03/21 0601 09/04/21 0449 09/05/21 0437  WBC 8.5 7.7 6.3 8.0  HGB 16.4* 13.9 14.5 14.8  HCT 46.6* 40.3 41.2 42.8  MCV 104.0* 104.9* 105.4* 105.7*  PLT 210 181 210 235   Cardiac Enzymes: No results for input(s): "CKTOTAL", "CKMB", "CKMBINDEX", "TROPONINI" in the last 168 hours. BNP: Invalid input(s): "POCBNP" CBG: Recent Labs  Lab 09/04/21 1126 09/04/21 1616 09/04/21 2058 09/05/21 0732 09/05/21 1120  GLUCAP 95 111* 118* 114* 153*   D-Dimer No results for input(s): "DDIMER" in the last 72 hours. Hgb A1c No results for input(s): "HGBA1C" in the last 72 hours. Lipid Profile No results for input(s): "CHOL", "HDL", "LDLCALC", "TRIG", "CHOLHDL", "LDLDIRECT" in the last 72 hours. Thyroid function studies No results for input(s): "TSH", "T4TOTAL", "T3FREE", "THYROIDAB" in the last 72 hours.  Invalid input(s): "FREET3" Anemia work up No results for input(s): "VITAMINB12", "FOLATE", "FERRITIN", "TIBC", "IRON", "RETICCTPCT" in the last 72 hours. Urinalysis    Component Value Date/Time   COLORURINE YELLOW 11/23/2020 1137   APPEARANCEUR CLEAR 11/23/2020 1137   LABSPEC 1.025 11/23/2020 1137   PHURINE 6.0 11/23/2020 1137   GLUCOSEU NEGATIVE 11/23/2020 1137   HGBUR NEGATIVE 11/23/2020 1137   BILIRUBINUR MODERATE (A) 11/23/2020 1137   KETONESUR 80 (A) 11/23/2020 1137   PROTEINUR 30 (A) 11/23/2020 1137   UROBILINOGEN 0.2 01/29/2008 2217   NITRITE NEGATIVE 11/23/2020 1137   LEUKOCYTESUR NEGATIVE 11/23/2020 1137   Sepsis Labs Recent Labs  Lab 09/02/21 0245 09/03/21 0601 09/04/21 0449 09/05/21 0437  WBC 8.5 7.7 6.3 8.0   Microbiology No results found  for this or any previous  visit (from the past 240 hour(s)).   Time coordinating discharge:  I have spent 35 minutes face to face with the patient and on the ward discussing the patients care, assessment, plan and disposition with other care givers. >50% of the time was devoted counseling the patient about the risks and benefits of treatment/Discharge disposition and coordinating care.   SIGNED:   Dimple Nanas, MD  [facility] 09/05/2021, 11:27 AM   If 7PM-7AM, please contact night-coverage

## 2021-09-07 ENCOUNTER — Telehealth (HOSPITAL_COMMUNITY): Payer: Self-pay | Admitting: Licensed Clinical Social Worker

## 2021-09-07 LAB — VITAMIN B1: Vitamin B1 (Thiamine): 208.9 nmol/L — ABNORMAL HIGH (ref 66.5–200.0)

## 2021-09-07 NOTE — Telephone Encounter (Signed)
The therapist returns a call from [place] confirming her identity via two identifiers. She says that she attended SA IOP at [facility] in 2007 and was sober for 15 years but relapsed two months ago and had to go to [facility] for detox.  She says that she has been sober for 10 days but is still foggy-headed and not wanting to drive yet. She has a trip scheduled and returns on 09/22/21 wanting to start SA IOP immediately after she returns.  The therapist schedules her for a CCA on 10/22/21 at 1 p.m.  Terri Maxwell says that she used to attend AA but stopped going as she thought that she no longer needed it.  [name], MA, LCSW, [facility], LCAS 09/07/2021

## 2021-09-14 ENCOUNTER — Telehealth (HOSPITAL_COMMUNITY): Payer: Self-pay | Admitting: Licensed Clinical Social Worker

## 2021-09-14 NOTE — Telephone Encounter (Signed)
The therapist attempts to reach Terri Maxwell in response to her voicemail regarding insurance difficulties and leaves a HIPAA-compliant voicemail.  [name], MA, LCSW, [facility], LCAS 09/14/2021

## 2021-09-18 ENCOUNTER — Telehealth (HOSPITAL_COMMUNITY): Payer: Self-pay | Admitting: Licensed Clinical Social Worker

## 2021-09-25 ENCOUNTER — Ambulatory Visit (HOSPITAL_COMMUNITY): Payer: Self-pay | Admitting: Licensed Clinical Social Worker

## 2021-09-25 ENCOUNTER — Telehealth (HOSPITAL_COMMUNITY): Payer: Self-pay | Admitting: Licensed Clinical Social Worker

## 2021-09-25 NOTE — Telephone Encounter (Signed)
The therapist attempts to reach [facility] when she does not arrive for her 1 p.m. appointment and leaves a HIPAA-compliant voicemail with his direct callback number.   [name], MA, LCSW, [facility], LCAS 09/25/2021

## 2021-09-29 ENCOUNTER — Other Ambulatory Visit: Payer: Self-pay

## 2021-09-29 ENCOUNTER — Inpatient Hospital Stay (HOSPITAL_COMMUNITY)
Admission: EM | Admit: 2021-09-29 | Discharge: 2021-10-03 | DRG: 070 | Disposition: A | Discharge status: Home / Self Care | Payer: BC Managed Care – PPO | Attending: Internal Medicine | Admitting: Internal Medicine

## 2021-09-29 ENCOUNTER — Emergency Department (HOSPITAL_COMMUNITY): Payer: BC Managed Care – PPO

## 2021-09-29 ENCOUNTER — Observation Stay (HOSPITAL_COMMUNITY): Payer: BC Managed Care – PPO

## 2021-09-29 ENCOUNTER — Encounter (HOSPITAL_COMMUNITY): Payer: Self-pay

## 2021-09-29 DIAGNOSIS — D66 Hereditary factor VIII deficiency: Secondary | ICD-10-CM | POA: Diagnosis present

## 2021-09-29 DIAGNOSIS — E876 Hypokalemia: Secondary | ICD-10-CM | POA: Diagnosis present

## 2021-09-29 DIAGNOSIS — Z880 Allergy status to penicillin: Secondary | ICD-10-CM

## 2021-09-29 DIAGNOSIS — F419 Anxiety disorder, unspecified: Secondary | ICD-10-CM | POA: Diagnosis not present

## 2021-09-29 DIAGNOSIS — Z885 Allergy status to narcotic agent status: Secondary | ICD-10-CM

## 2021-09-29 DIAGNOSIS — R4182 Altered mental status, unspecified: Secondary | ICD-10-CM | POA: Diagnosis present

## 2021-09-29 DIAGNOSIS — F909 Attention-deficit hyperactivity disorder, unspecified type: Secondary | ICD-10-CM | POA: Diagnosis present

## 2021-09-29 DIAGNOSIS — G9341 Metabolic encephalopathy: Secondary | ICD-10-CM | POA: Diagnosis not present

## 2021-09-29 DIAGNOSIS — Z20822 Contact with and (suspected) exposure to covid-19: Secondary | ICD-10-CM | POA: Diagnosis present

## 2021-09-29 DIAGNOSIS — F10130 Alcohol abuse with withdrawal, uncomplicated: Secondary | ICD-10-CM | POA: Diagnosis present

## 2021-09-29 DIAGNOSIS — Z5941 Food insecurity: Secondary | ICD-10-CM

## 2021-09-29 DIAGNOSIS — E860 Dehydration: Secondary | ICD-10-CM | POA: Diagnosis present

## 2021-09-29 DIAGNOSIS — G9340 Encephalopathy, unspecified: Principal | ICD-10-CM

## 2021-09-29 DIAGNOSIS — Z881 Allergy status to other antibiotic agents status: Secondary | ICD-10-CM

## 2021-09-29 DIAGNOSIS — Z79899 Other long term (current) drug therapy: Secondary | ICD-10-CM

## 2021-09-29 DIAGNOSIS — F32A Depression, unspecified: Secondary | ICD-10-CM | POA: Diagnosis present

## 2021-09-29 DIAGNOSIS — K219 Gastro-esophageal reflux disease without esophagitis: Secondary | ICD-10-CM | POA: Diagnosis present

## 2021-09-29 DIAGNOSIS — F1010 Alcohol abuse, uncomplicated: Secondary | ICD-10-CM | POA: Diagnosis not present

## 2021-09-29 DIAGNOSIS — I10 Essential (primary) hypertension: Secondary | ICD-10-CM | POA: Diagnosis present

## 2021-09-29 DIAGNOSIS — Y900 Blood alcohol level of less than 20 mg/100 ml: Secondary | ICD-10-CM | POA: Diagnosis present

## 2021-09-29 DIAGNOSIS — Z9183 Wandering in diseases classified elsewhere: Secondary | ICD-10-CM

## 2021-09-29 DIAGNOSIS — Z87891 Personal history of nicotine dependence: Secondary | ICD-10-CM

## 2021-09-29 DIAGNOSIS — Z23 Encounter for immunization: Secondary | ICD-10-CM

## 2021-09-29 LAB — BASIC METABOLIC PANEL
Anion gap: 12 (ref 5–15)
BUN: 11 mg/dL (ref 6–20)
CO2: 21 mmol/L — ABNORMAL LOW (ref 22–32)
Calcium: 8.5 mg/dL — ABNORMAL LOW (ref 8.9–10.3)
Chloride: 108 mmol/L (ref 98–111)
Creatinine, Ser: 0.85 mg/dL (ref 0.44–1.00)
GFR, Estimated: >60 mL/min (ref >60)
Glucose, Bld: 96 mg/dL (ref 70–99)
Potassium: 3.1 mmol/L — ABNORMAL LOW (ref 3.5–5.1)
Sodium: 141 mmol/L (ref 135–145)

## 2021-09-29 LAB — COMPREHENSIVE METABOLIC PANEL
ALT: 32 U/L (ref 0–44)
AST: 42 U/L — ABNORMAL HIGH (ref 15–41)
Albumin: 5.2 g/dL — ABNORMAL HIGH (ref 3.5–5.0)
Alkaline Phosphatase: 80 U/L (ref 38–126)
Anion gap: 14 (ref 5–15)
BUN: 12 mg/dL (ref 6–20)
CO2: 24 mmol/L (ref 22–32)
Calcium: 11 mg/dL — ABNORMAL HIGH (ref 8.9–10.3)
Chloride: 100 mmol/L (ref 98–111)
Creatinine, Ser: 1.09 mg/dL — ABNORMAL HIGH (ref 0.44–1.00)
GFR, Estimated: >60 mL/min (ref >60)
Glucose, Bld: 122 mg/dL — ABNORMAL HIGH (ref 70–99)
Potassium: 4 mmol/L (ref 3.5–5.1)
Sodium: 138 mmol/L (ref 135–145)
Total Bilirubin: 1.4 mg/dL — ABNORMAL HIGH (ref 0.3–1.2)
Total Protein: 8.3 g/dL — ABNORMAL HIGH (ref 6.5–8.1)

## 2021-09-29 LAB — MAGNESIUM: Magnesium: 1.6 mg/dL — ABNORMAL LOW (ref 1.7–2.4)

## 2021-09-29 LAB — CBC WITH DIFFERENTIAL/PLATELET
Abs Immature Granulocytes: 0.04 10*3/uL (ref 0.00–0.07)
Basophils Absolute: 0.1 10*3/uL (ref 0.0–0.1)
Basophils Relative: 1 %
Eosinophils Absolute: 0 10*3/uL (ref 0.0–0.5)
Eosinophils Relative: 0 %
HCT: 46.2 % — ABNORMAL HIGH (ref 36.0–46.0)
Hemoglobin: 15.9 g/dL — ABNORMAL HIGH (ref 12.0–15.0)
Immature Granulocytes: 0 %
Lymphocytes Relative: 13 %
Lymphs Abs: 1.6 10*3/uL (ref 0.7–4.0)
MCH: 35.7 pg — ABNORMAL HIGH (ref 26.0–34.0)
MCHC: 34.4 g/dL (ref 30.0–36.0)
MCV: 103.6 fL — ABNORMAL HIGH (ref 80.0–100.0)
Monocytes Absolute: 1.3 10*3/uL — ABNORMAL HIGH (ref 0.1–1.0)
Monocytes Relative: 11 %
Neutro Abs: 9.3 10*3/uL — ABNORMAL HIGH (ref 1.7–7.7)
Neutrophils Relative %: 75 %
Platelets: 246 10*3/uL (ref 150–400)
RBC: 4.46 MIL/uL (ref 3.87–5.11)
RDW: 12.1 % (ref 11.5–15.5)
WBC: 12.4 10*3/uL — ABNORMAL HIGH (ref 4.0–10.5)
nRBC: 0 % (ref 0.0–0.2)

## 2021-09-29 LAB — RAPID URINE DRUG SCREEN, HOSP PERFORMED
Amphetamines: NOT DETECTED
Barbiturates: NOT DETECTED
Benzodiazepines: POSITIVE — AB
Cocaine: NOT DETECTED
Opiates: NOT DETECTED
Tetrahydrocannabinol: POSITIVE — AB

## 2021-09-29 LAB — URINALYSIS, ROUTINE W REFLEX MICROSCOPIC
Bilirubin Urine: NEGATIVE
Glucose, UA: NEGATIVE mg/dL
Hgb urine dipstick: NEGATIVE
Ketones, ur: 20 mg/dL — AB
Leukocytes,Ua: NEGATIVE
Nitrite: NEGATIVE
Protein, ur: 30 mg/dL — AB
Specific Gravity, Urine: 1.021 (ref 1.005–1.030)
pH: 6 (ref 5.0–8.0)

## 2021-09-29 LAB — ACETAMINOPHEN LEVEL: Acetaminophen (Tylenol), Serum: <10 ug/mL — ABNORMAL LOW (ref 10–30)

## 2021-09-29 LAB — SARS CORONAVIRUS 2 BY RT PCR: SARS Coronavirus 2 by RT PCR: NEGATIVE

## 2021-09-29 LAB — CBG MONITORING, ED: Glucose-Capillary: 98 mg/dL (ref 70–99)

## 2021-09-29 LAB — SALICYLATE LEVEL: Salicylate Lvl: <7 mg/dL — ABNORMAL LOW (ref 7.0–30.0)

## 2021-09-29 LAB — LACTIC ACID, PLASMA: Lactic Acid, Venous: 1.8 mmol/L (ref 0.5–1.9)

## 2021-09-29 LAB — ETHANOL: Alcohol, Ethyl (B): <10 mg/dL (ref <10)

## 2021-09-29 LAB — AMMONIA: Ammonia: 25 umol/L (ref 9–35)

## 2021-09-29 LAB — PHOSPHORUS: Phosphorus: 3.5 mg/dL (ref 2.5–4.6)

## 2021-09-29 MED ORDER — THIAMINE HCL 100 MG PO TABS
100.0000 mg | ORAL_TABLET | Freq: Every day | ORAL | Status: DC
  Administered 2021-09-30 – 2021-10-03 (×4): 100 mg via ORAL
  Filled 2021-09-29 (×4): qty 1

## 2021-09-29 MED ORDER — SODIUM CHLORIDE 0.9 % IV SOLN
1000.0000 mL | INTRAVENOUS | Status: DC
  Administered 2021-09-29 – 2021-09-30 (×2): 1000 mL via INTRAVENOUS

## 2021-09-29 MED ORDER — ONDANSETRON HCL 4 MG PO TABS: 4.0000 mg | ORAL_TABLET | Freq: Four times a day (QID) | ORAL | Status: DC | PRN

## 2021-09-29 MED ORDER — METOPROLOL TARTRATE 5 MG/5ML IV SOLN: 5.0000 mg | Freq: Four times a day (QID) | INTRAVENOUS | Status: DC | PRN

## 2021-09-29 MED ORDER — VITAMIN B-12 1000 MCG PO TABS
1000.0000 ug | ORAL_TABLET | Freq: Every day | ORAL | Status: DC
  Administered 2021-09-29 – 2021-10-03 (×5): 1000 ug via ORAL
  Filled 2021-09-29 (×5): qty 1

## 2021-09-29 MED ORDER — PANTOPRAZOLE SODIUM 40 MG PO TBEC
40.0000 mg | DELAYED_RELEASE_TABLET | Freq: Every day | ORAL | Status: DC
  Administered 2021-09-29 – 2021-10-03 (×5): 40 mg via ORAL
  Filled 2021-09-29 (×5): qty 1

## 2021-09-29 MED ORDER — POTASSIUM CHLORIDE CRYS ER 20 MEQ PO TBCR
40.0000 meq | EXTENDED_RELEASE_TABLET | ORAL | Status: AC
  Administered 2021-09-29 – 2021-09-30 (×2): 40 meq via ORAL
  Filled 2021-09-29 (×2): qty 2

## 2021-09-29 MED ORDER — LOPERAMIDE HCL 2 MG PO CAPS: 2.0000 mg | ORAL_CAPSULE | ORAL | Status: AC | PRN

## 2021-09-29 MED ORDER — FLUTICASONE PROPIONATE 50 MCG/ACT NA SUSP
2.0000 | Freq: Every day | NASAL | Status: DC
  Filled 2021-09-29: qty 16

## 2021-09-29 MED ORDER — METHYLPHENIDATE HCL ER (OSM) 27 MG PO TBCR
54.0000 mg | EXTENDED_RELEASE_TABLET | Freq: Every morning | ORAL | Status: DC
Start: 2021-09-30 — End: 2021-10-03
  Administered 2021-09-30 – 2021-10-03 (×4): 54 mg via ORAL
  Filled 2021-09-29 (×4): qty 2

## 2021-09-29 MED ORDER — AMLODIPINE BESYLATE 5 MG PO TABS
5.0000 mg | ORAL_TABLET | Freq: Every day | ORAL | Status: DC
  Administered 2021-09-29 – 2021-10-03 (×5): 5 mg via ORAL
  Filled 2021-09-29 (×5): qty 1

## 2021-09-29 MED ORDER — HYDROXYZINE HCL 25 MG PO TABS
25.0000 mg | ORAL_TABLET | Freq: Four times a day (QID) | ORAL | Status: AC | PRN
  Administered 2021-10-02: 25 mg via ORAL
  Filled 2021-09-29: qty 1

## 2021-09-29 MED ORDER — LORAZEPAM 2 MG/ML IJ SOLN
1.0000 mg | Freq: Once | INTRAMUSCULAR | Status: AC
  Administered 2021-09-29: 1 mg via INTRAVENOUS
  Filled 2021-09-29: qty 1

## 2021-09-29 MED ORDER — LORATADINE 10 MG PO TABS
10.0000 mg | ORAL_TABLET | Freq: Every day | ORAL | Status: DC
  Administered 2021-09-29 – 2021-10-03 (×5): 10 mg via ORAL
  Filled 2021-09-29 (×5): qty 1

## 2021-09-29 MED ORDER — SORBITOL 70 % SOLN: 30.0000 mL | Freq: Every day | Status: DC | PRN

## 2021-09-29 MED ORDER — FLUTICASONE PROPIONATE 50 MCG/ACT NA SUSP
2.0000 | Freq: Every day | NASAL | Status: DC
  Administered 2021-09-30 – 2021-10-03 (×3): 2 via NASAL
  Filled 2021-09-29 (×2): qty 16

## 2021-09-29 MED ORDER — ALBUTEROL SULFATE (2.5 MG/3ML) 0.083% IN NEBU: 2.5000 mg | INHALATION_SOLUTION | RESPIRATORY_TRACT | Status: DC | PRN

## 2021-09-29 MED ORDER — CHLORDIAZEPOXIDE HCL 25 MG PO CAPS
25.0000 mg | ORAL_CAPSULE | Freq: Three times a day (TID) | ORAL | Status: AC
  Administered 2021-10-01 (×3): 25 mg via ORAL
  Filled 2021-09-29 (×3): qty 1

## 2021-09-29 MED ORDER — ADULT MULTIVITAMIN W/MINERALS CH
1.0000 | ORAL_TABLET | Freq: Every day | ORAL | Status: DC
  Administered 2021-09-29 – 2021-10-03 (×5): 1 via ORAL
  Filled 2021-09-29 (×5): qty 1

## 2021-09-29 MED ORDER — ONDANSETRON HCL 4 MG/2ML IJ SOLN: 4.0000 mg | Freq: Four times a day (QID) | INTRAMUSCULAR | Status: DC | PRN

## 2021-09-29 MED ORDER — CHLORDIAZEPOXIDE HCL 25 MG PO CAPS
25.0000 mg | ORAL_CAPSULE | Freq: Four times a day (QID) | ORAL | Status: DC | PRN
  Administered 2021-10-01 – 2021-10-03 (×6): 25 mg via ORAL
  Filled 2021-09-29 (×7): qty 1

## 2021-09-29 MED ORDER — CHLORDIAZEPOXIDE HCL 25 MG PO CAPS
25.0000 mg | ORAL_CAPSULE | Freq: Every day | ORAL | Status: AC
  Administered 2021-10-03: 25 mg via ORAL
  Filled 2021-09-29: qty 1

## 2021-09-29 MED ORDER — CHLORDIAZEPOXIDE HCL 25 MG PO CAPS
25.0000 mg | ORAL_CAPSULE | Freq: Four times a day (QID) | ORAL | Status: AC
  Administered 2021-09-29 – 2021-09-30 (×5): 25 mg via ORAL
  Filled 2021-09-29 (×5): qty 1

## 2021-09-29 MED ORDER — ONDANSETRON 4 MG PO TBDP: 4.0000 mg | ORAL_TABLET | Freq: Four times a day (QID) | ORAL | Status: DC | PRN

## 2021-09-29 MED ORDER — ACETAMINOPHEN 325 MG PO TABS
650.0000 mg | ORAL_TABLET | Freq: Four times a day (QID) | ORAL | Status: DC | PRN
  Administered 2021-10-01: 650 mg via ORAL
  Administered 2021-10-01 – 2021-10-02 (×2): 325 mg via ORAL
  Administered 2021-10-02: 650 mg via ORAL
  Administered 2021-10-03: 325 mg via ORAL
  Filled 2021-09-29 (×5): qty 2

## 2021-09-29 MED ORDER — SODIUM CHLORIDE 0.9 % IV BOLUS (SEPSIS)
2000.0000 mL | Freq: Once | INTRAVENOUS | Status: AC
  Administered 2021-09-29: 2000 mL via INTRAVENOUS

## 2021-09-29 MED ORDER — ENOXAPARIN SODIUM 40 MG/0.4ML IJ SOSY
40.0000 mg | PREFILLED_SYRINGE | INTRAMUSCULAR | Status: DC
  Administered 2021-09-29 – 2021-10-02 (×4): 40 mg via SUBCUTANEOUS
  Filled 2021-09-29 (×4): qty 0.4

## 2021-09-29 MED ORDER — POLYETHYLENE GLYCOL 3350 17 G PO PACK
17.0000 g | PACK | Freq: Every day | ORAL | Status: DC | PRN
  Administered 2021-09-30: 17 g via ORAL
  Filled 2021-09-29: qty 1

## 2021-09-29 MED ORDER — CHLORDIAZEPOXIDE HCL 25 MG PO CAPS
25.0000 mg | ORAL_CAPSULE | ORAL | Status: AC
  Administered 2021-10-02 (×2): 25 mg via ORAL
  Filled 2021-09-29 (×2): qty 1

## 2021-09-29 MED ORDER — SODIUM CHLORIDE 0.9% FLUSH
3.0000 mL | Freq: Two times a day (BID) | INTRAVENOUS | Status: DC
  Administered 2021-09-29 – 2021-10-03 (×8): 3 mL via INTRAVENOUS

## 2021-09-29 MED ORDER — ACETAMINOPHEN 650 MG RE SUPP: 650.0000 mg | Freq: Four times a day (QID) | RECTAL | Status: DC | PRN

## 2021-09-29 MED ORDER — THIAMINE HCL 100 MG/ML IJ SOLN: 100.0000 mg | Freq: Once | INTRAMUSCULAR | Status: DC

## 2021-09-29 MED ORDER — THIAMINE HCL 100 MG/ML IJ SOLN
100.0000 mg | Freq: Every day | INTRAMUSCULAR | Status: DC
  Administered 2021-09-29 – 2021-09-30 (×2): 100 mg via INTRAVENOUS
  Filled 2021-09-29 (×2): qty 2

## 2021-09-29 NOTE — ED Triage Notes (Signed)
Pt BIB [facility] from her apartment complex and was found wondering around. Pt states she got locked out of her apartment. Per daughter pt has a hx of ETOH abuse and sleeping pills.

## 2021-09-29 NOTE — ED Provider Triage Note (Signed)
Emergency Medicine Provider Triage Evaluation Note  Terri Maxwell , a 56 y.o. female  was evaluated in triage.  Pt complains of altered mental status.  Patient brought in after found wandering around her apartment complex without shoes locked out of her apartment.  Patient is oriented to person and place but answering questions in a confused manner.  She currently states that she was going to take a flight to [facility] today but got confused and thought somebody left her and was panicked.  She denies using alcohol or drugs however apparently has a history of this per EMS.  Review of Systems  Positive: Confusion Negative: Head trauma  Physical Exam  BP (!) 162/101 (BP Location: Right Arm)   Pulse (!) 109   Temp 97.9 F (36.6 C) (Oral)   Resp 14   SpO2 97%  Gen:   Awake, no distress   Resp:  Normal effort  MSK:   Moves extremities without difficulty  Other:  Confused  Medical Decision Making  Medically screening exam initiated at 12:14 PM.  Appropriate orders placed.  Ajanae Mattera was informed that the remainder of the evaluation will be completed by another provider, this initial triage assessment does not replace that evaluation, and the importance of remaining in the ED until their evaluation is complete.  Work-up initiated   Arthor Captain, PA-C 09/29/21 1220

## 2021-09-29 NOTE — H&P (Signed)
History and Physical    Terri Maxwell IWP:[id number] DOB: 1965/12/22 DOA: 09/29/2021  PCP: System, Provider Not In  Patient coming from: Home  I have personally briefly reviewed patient's old medical records in [facility]  Chief Complaint: Confusion/disorientation  HPI: Terri Maxwell is a 56 y.o. female with medical history significant of ADHD, alcohol abuse ongoing, depression, GERD who presented to ED with confusion/disorientation and altered mental status.  Per ED physician's note EMS was called to patient's apartment complex as she was found wandering around.  Patient had told EMS she got locked out of her apartment.  Per family members patient does have a history of alcohol use and use of sleeping pills.  Patient told ED physician she was getting ready to go to the airport, she was out working outside and someone saw the patient and stated she did not look good enough.  Patient stated was noted to be confused and was out walking somebody told her she did not look good, was walking gingerly and patient taken to the office.  EMS was called and patient brought to the ED.  Patient denies any recent fever, no chills, no nausea, no vomiting, no chest pain, no shortness of breath, no abdominal pain, no hematemesis, hematochezia, no melena, no diarrhea, no constipation, no dysuria, no increased urinary frequency.  Patient with some complaints of congestion, patient states she has had a poor appetite with decreased oral intake and has just been drinking water and eating popsicles.  When probed further patient states does not have a job and unable to afford groceries.  Patient stated 2 weeks ago had some visual hallucinations and has had none since then.  Patient denies any recent alcohol use however daughter at bedside stated patient was drinking 2 days prior to admission. Daughter at bedside stated on day of admission she was called by patient's apartment complex that patient was noted to be  disoriented and confused.  She also noted that patient was rambling.  Patient brought to the ED per EMS  ED Course: Patient seen in the ED, initially noted to have a tachycardia, blood pressure initially 105/65 and subsequently up to 162/101.  Patient given a 2 L fluid bolus and placed on IV fluids.  Head CT done negative for any acute abnormalities initial comprehensive metabolic profile done with a calcium of 11, creatinine of 1.09, albumin of 5.2, AST of 42, protein of 8.3, bilirubin of 1.4 otherwise was within normal limits.  Lactic acid level noted at 1.8.  CBC done with a white count of 12.4, hemoglobin of 15.9 otherwise was within normal limits.  Acetaminophen level < 10, salicylate level < 7, alcohol level < 10.  Urinalysis pending. On my assessment in the ED patient had improved clinically alert and oriented x3.  Review of Systems: As per HPI otherwise all other systems reviewed and are negative.  Past Medical History:  Diagnosis Date   Depression    ETOH abuse    Hemophilia (HCC)     History reviewed. No pertinent surgical history.  Social History  reports that she has quit smoking. Her smoking use included cigarettes. She has never used smokeless tobacco. She reports current alcohol use. No history on file for drug use.  Allergies  Allergen Reactions   Codeine Shortness Of Breath    Tongue Swelling   Keflex [Cephalexin]     Tongue Swelling   Penicillins     Did it involve swelling of the face/tongue/throat, SOB, or low BP? Yes  Did it involve sudden or severe rash/hives, skin peeling, or any reaction on the inside of your mouth or nose? Yes Did you need to seek medical attention at a hospital or doctor's office? Yes When did it last happen?   Over 20 Years Ago    If all above answers are "NO", may proceed with cephalosporin use.      History reviewed. No pertinent family history. Mother alive age 10 with borderline high blood pressure per patient.  Father deceased age 93  had a heart murmur and was having a procedure that ended up with some complications.  Prior to Admission medications   Medication Sig Start Date End Date Taking? Authorizing Provider  folic acid (FOLVITE) 1 MG tablet Take 1 tablet (1 mg total) by mouth daily. 09/06/21 10/06/21  Amin, Loura Halt, MD  methylphenidate 54 MG PO CR tablet Take 54 mg by mouth every morning. 08/28/21   [provider]  pantoprazole (PROTONIX) 40 MG tablet Take 1 tablet (40 mg total) by mouth daily before breakfast. 09/05/21 10/05/21  Amin, Loura Halt, MD  thiamine 100 MG tablet Take 1 tablet (100 mg total) by mouth daily. 09/06/21 10/06/21  Amin, Loura Halt, MD  vitamin B-12 1000 MCG tablet Take 1 tablet (1,000 mcg total) by mouth daily. 09/06/21 10/06/21  Amin, Loura Halt, MD  zolpidem (AMBIEN CR) 12.5 MG CR tablet Take 12.5 mg by mouth at bedtime as needed for sleep.    [provider]    Physical Exam: Vitals:   09/29/21 1730 09/29/21 1800 09/29/21 1830 09/29/21 1918  BP: 120/77 126/77 (!) 130/99 (!) 146/92  Pulse: 82 84 100 87  Resp: 16  12 18   Temp:    98.3 F (36.8 C)  TempSrc:    Oral  SpO2: 95% 97% 96% 99%    Constitutional: NAD, calm, comfortable. Vitals:   09/29/21 1730 09/29/21 1800 09/29/21 1830 09/29/21 1918  BP: 120/77 126/77 (!) 130/99 (!) 146/92  Pulse: 82 84 100 87  Resp: 16  12 18   Temp:    98.3 F (36.8 C)  TempSrc:    Oral  SpO2: 95% 97% 96% 99%   Eyes: PERRL, lids and conjunctivae normal ENMT: Mucous membranes are dry. Posterior pharynx clear of any exudate or lesions.Normal dentition.  Neck: normal, supple, no masses, no thyromegaly Respiratory: clear to auscultation bilaterally, no wheezing, no crackles. Normal respiratory effort. No accessory muscle use.  Cardiovascular: Regular rate and rhythm, no murmurs / rubs / gallops. No extremity edema. 2+ pedal pulses. No carotid bruits.  Abdomen: no tenderness, no masses palpated. No hepatosplenomegaly. Bowel sounds  positive.  Musculoskeletal: no clubbing / cyanosis. No joint deformity upper and lower extremities. Good ROM, no contractures. Normal muscle tone.  Skin: no rashes, lesions, ulcers. No induration Neurologic: CN 2-12 grossly intact. Sensation intact, DTR normal. Strength 5/5 in all 4.  Psychiatric: Normal judgment and insight. Alert and oriented x 3. Normal mood.    Labs on Admission: I have personally reviewed following labs and imaging studies  CBC: Recent Labs  Lab 09/29/21 1225  WBC 12.4*  NEUTROABS 9.3*  HGB 15.9*  HCT 46.2*  MCV 103.6*  PLT 246    Basic Metabolic Panel: Recent Labs  Lab 09/29/21 1225 09/29/21 1649  NA 138 141  K 4.0 3.1*  CL 100 108  CO2 24 21*  GLUCOSE 122* 96  BUN 12 11  CREATININE 1.09* 0.85  CALCIUM 11.0* 8.5*    GFR: CrCl cannot be calculated (  Unknown ideal weight.).  Liver Function Tests: Recent Labs  Lab 09/29/21 1225  AST 42*  ALT 32  ALKPHOS 80  BILITOT 1.4*  PROT 8.3*  ALBUMIN 5.2*    Urine analysis:    Component Value Date/Time   COLORURINE AMBER (A) 09/29/2021 1639   APPEARANCEUR HAZY (A) 09/29/2021 1639   LABSPEC 1.021 09/29/2021 1639   PHURINE 6.0 09/29/2021 1639   GLUCOSEU NEGATIVE 09/29/2021 1639   HGBUR NEGATIVE 09/29/2021 1639   BILIRUBINUR NEGATIVE 09/29/2021 1639   KETONESUR 20 (A) 09/29/2021 1639   PROTEINUR 30 (A) 09/29/2021 1639   UROBILINOGEN 0.2 01/29/2008 2217   NITRITE NEGATIVE 09/29/2021 1639   LEUKOCYTESUR NEGATIVE 09/29/2021 1639    Radiological Exams on Admission: CT Head Wo Contrast  Result Date: 09/29/2021 CLINICAL DATA:  Mental status change.  Unknown cause. EXAM: CT HEAD WITHOUT CONTRAST TECHNIQUE: Contiguous axial images were obtained from the base of the skull through the vertex without intravenous contrast. RADIATION DOSE REDUCTION: This exam was performed according to the departmental dose-optimization program which includes automated exposure control, adjustment of the mA and/or kV  according to patient size and/or use of iterative reconstruction technique. COMPARISON:  CT head 01/30/2008. FINDINGS: Brain: There is no evidence of acute intracranial hemorrhage, mass lesion, brain edema or extra-axial fluid collection. Progressive atrophy with prominence of the ventricles and subarachnoid spaces. Patchy low-density in the periventricular white matter most consistent with chronic small vessel ischemic changes. There is no CT evidence of acute cortical infarction. Vascular:  No hyperdense vessel identified. Skull: Negative for fracture or focal lesion. Sinuses/Orbits: The visualized paranasal sinuses and mastoid air cells are clear. No orbital abnormalities are seen. Other: None. IMPRESSION: No acute intracranial findings. Mildly progressive atrophy and chronic small vessel ischemic changes in the periventricular white matter. Electronically Signed   By: Carey Bullocks M.D.   On: 09/29/2021 14:38    EKG: Independently reviewed.  Sinus tachycardia with heart rate of 101.  Assessment/Plan Principal Problem:   Acute metabolic encephalopathy Active Problems:   Alcohol abuse   Depression   Hypokalemia   Hypercalcemia   HTN (hypertension)   GERD (gastroesophageal reflux disease)   #1 acute metabolic encephalopathy -Patient presented with confusion, disorientation, noted to be rambling. -Differential diagnosis likely multifactorial secondary to dehydration, possibility of alcohol withdrawal, probable polypharmacy/increased sleeping medications as per daughter patient has a history of taking lots of sleeping pills. -Patient received 2 L bolus of IV fluids placed on normal saline. -At time of interview patient with clinical improvement alert and oriented to self place and time. -We will rule out infectious etiology and check a chest x-ray. -Urinalysis done nitrite negative, leukocytes negative, 30 protein, 0-5 WBCs. -Salicylate level negative, alcohol level negative, Tylenol level  negative. -UDS pending. -Continue IV fluid resuscitation, placed on the Librium withdrawal protocol, hold home regimen of Ambien and sedative medications. -Supportive care.  2.  Dehydration -IV fluids.  3.  Hypercalcemia -Likely secondary to dehydration. -Repeat labs after fluid resuscitation with improvement with hypercalcemia. -Follow.  4.  GERD -PPI.  5.  Hypokalemia -Check a magnesium level. -K-Dur 40 mEq p.o. every 4 hours x2 doses. -Repeat labs in the AM.  6.  History of alcohol abuse -Patient denies any recent history of alcohol use stating last alcohol use was 2 weeks prior to admission however daughter does state patient was drinking 2 days prior to admission. -Place on the Librium detox protocol.  7. ?  Hypertension -Follow blood pressure. -Not on antihypertensive medications prior  to admission.  DVT prophylaxis: Lovenox Code Status:   Full (discussed with patient and daughter at bedside.) Family Communication:  Updated patient and daughter Terri Maxwell at bedside Disposition Plan:   Patient is from:  Home  Anticipated DC to:  Likely home  Anticipated DC date:  2 to 3 days  Anticipated DC barriers: Clinical improvement  Consults called: None Admission status:  Placed in observation  Severity of Illness: The appropriate patient status for this patient is OBSERVATION. Observation status is judged to be reasonable and necessary in order to provide the required intensity of service to ensure the patient's safety. The patient's presenting symptoms, physical exam findings, and initial radiographic and laboratory data in the context of their medical condition is felt to place them at decreased risk for further clinical deterioration. Furthermore, it is anticipated that the patient will be medically stable for discharge from the hospital within 2 midnights of admission.     Ramiro Harvest MD [facility]  How to contact the [facility] Attending or Consulting provider 7A  - 7P or covering provider during after hours 7P -7A, for this patient?   Check the care team in [facility] and look for a) attending/consulting [facility] provider listed and b) the [facility] team listed Log into [email or web address] and use [facility]'s universal password to access. If you do not have the password, please contact the hospital operator. Locate the [facility] provider you are looking for under [facility] and page to a number that you can be directly reached. If you still have difficulty reaching the provider, please page the [facility] (Director on Call) for the Hospitalists listed on amion for assistance.  09/29/2021, 7:43 PM

## 2021-09-29 NOTE — ED Notes (Signed)
Admitting provider at bedside to evaluate patient and update her and family at bedside on plan of care.

## 2021-09-29 NOTE — ED Provider Notes (Addendum)
Patient care was taken over from Dr. Lynelle Doctor.  She had presented with altered mental status and hallucinations.  She has had a history in the past of alcohol use and overusing her home medications, particularly her sleep medications per her daughter at bedside.  She has not had any infectious type symptoms.  No meningeal symptoms.  No fever.  Nothing that was revealing on her lab work as the etiology for her encephalopathy.  Head CT does not show any acute abnormalities.  Currently she is sleeping but per Dr. Lynelle Doctor, she did not have any focal neurologic deficits.  CBG is within normal limits.  Plan for admission.  I spoke with Dr. Janee Morn.   Rolan Bucco, MD 09/29/21 1821    Rolan Bucco, MD 09/29/21 7614

## 2021-09-29 NOTE — ED Provider Notes (Signed)
[facility] EMERGENCY DEPARTMENT Provider Note   CSN: [id number] Arrival date & time: 09/29/21  1142     History {Add pertinent medical, surgical, social history, OB history to HPI:1} Chief Complaint  Patient presents with   Altered Mental Status    Terri Maxwell is a 56 y.o. female.   Altered Mental Status Associated symptoms: no fever    Patient has history of depression, alcohol abuse, who presents to the ED for altered mental status.  EMS was called to her apartment complex as they found the patient wandering around.  Patient told EMS she got locked out of her apartment.  Per family members patient does have a history of alcohol abuse and use of sleeping pills.  Patient tells me that she was getting ready to go to the airport.  She was out working outside when I told her she did not look good.  Patient thought she just needed to drink some fluids.  Patient is not aware that she is in the hospital and thought she was at the airport.  She states she feels fine.  She denies having any headache.  No fevers or chills.  No chest pain    Home Medications Prior to Admission medications   Medication Sig Start Date End Date Taking? Authorizing Provider  folic acid (FOLVITE) 1 MG tablet Take 1 tablet (1 mg total) by mouth daily. 09/06/21 10/06/21  Amin, Loura Halt, MD  methylphenidate 54 MG PO CR tablet Take 54 mg by mouth every morning. 08/28/21   [provider]  pantoprazole (PROTONIX) 40 MG tablet Take 1 tablet (40 mg total) by mouth daily before breakfast. 09/05/21 10/05/21  Amin, Loura Halt, MD  thiamine 100 MG tablet Take 1 tablet (100 mg total) by mouth daily. 09/06/21 10/06/21  Amin, Loura Halt, MD  vitamin B-12 1000 MCG tablet Take 1 tablet (1,000 mcg total) by mouth daily. 09/06/21 10/06/21  Amin, Loura Halt, MD  zolpidem (AMBIEN CR) 12.5 MG CR tablet Take 12.5 mg by mouth at bedtime as needed for sleep.    [provider]      Allergies     Codeine, Keflex [cephalexin], and Penicillins    Review of Systems   Review of Systems  Constitutional:  Negative for fever.    Physical Exam Updated Vital Signs BP (!) 150/103   Pulse 95   Temp 97.9 F (36.6 C) (Oral)   Resp (!) 24   SpO2 96%  Physical Exam Vitals and nursing note reviewed.  Constitutional:      General: She is not in acute distress.    Appearance: She is well-developed.  HENT:     Head: Normocephalic and atraumatic.     Right Ear: External ear normal.     Left Ear: External ear normal.  Eyes:     General: No scleral icterus.       Right eye: No discharge.        Left eye: No discharge.     Conjunctiva/sclera: Conjunctivae normal.  Neck:     Trachea: No tracheal deviation.  Cardiovascular:     Rate and Rhythm: Normal rate and regular rhythm.  Pulmonary:     Effort: Pulmonary effort is normal. No respiratory distress.     Breath sounds: Normal breath sounds. No stridor. No wheezing or rales.  Abdominal:     General: Bowel sounds are normal. There is no distension.     Palpations: Abdomen is soft.     Tenderness: There  is no abdominal tenderness. There is no guarding or rebound.  Musculoskeletal:        General: No tenderness or deformity.     Cervical back: Neck supple.  Skin:    General: Skin is warm and dry.     Findings: No rash.  Neurological:     General: No focal deficit present.     Mental Status: She is alert. She is confused.     GCS: GCS eye subscore is 4. GCS verbal subscore is 4. GCS motor subscore is 6.     Cranial Nerves: No cranial nerve deficit (no facial droop, extraocular movements intact, no slurred speech).     Sensory: No sensory deficit.     Motor: No tremor, abnormal muscle tone or seizure activity.     Coordination: Coordination normal.     Comments: Patient answering questions but is confused, thinks she was at the airport.  Anxious to leave the hospital  Psychiatric:        Mood and Affect: Mood normal.     ED  Results / Procedures / Treatments   Labs (all labs ordered are listed, but only abnormal results are displayed) Labs Reviewed  CBC WITH DIFFERENTIAL/PLATELET - Abnormal; Notable for the following components:      Result Value   WBC 12.4 (*)    Hemoglobin 15.9 (*)    HCT 46.2 (*)    MCV 103.6 (*)    MCH 35.7 (*)    Neutro Abs 9.3 (*)    Monocytes Absolute 1.3 (*)    All other components within normal limits  COMPREHENSIVE METABOLIC PANEL - Abnormal; Notable for the following components:   Glucose, Bld 122 (*)    Creatinine, Ser 1.09 (*)    Calcium 11.0 (*)    Total Protein 8.3 (*)    Albumin 5.2 (*)    AST 42 (*)    Total Bilirubin 1.4 (*)    All other components within normal limits  SALICYLATE LEVEL - Abnormal; Notable for the following components:   Salicylate Lvl <7.0 (*)    All other components within normal limits  ACETAMINOPHEN LEVEL - Abnormal; Notable for the following components:   Acetaminophen (Tylenol), Serum <10 (*)    All other components within normal limits  LACTIC ACID, PLASMA  AMMONIA  ETHANOL  URINALYSIS, ROUTINE W REFLEX MICROSCOPIC  URINALYSIS, ROUTINE W REFLEX MICROSCOPIC  RAPID URINE DRUG SCREEN, HOSP PERFORMED  CBG MONITORING, ED    EKG EKG Interpretation  Date/Time:  Friday September 29 2021 13:31:15 EDT Ventricular Rate:  101 PR Interval:  147 QRS Duration: 81 QT Interval:  348 QTC Calculation: 452 R Axis:   28 Text Interpretation: Sinus tachycardia Low voltage, precordial leads Since last tracing rate faster Confirmed by Linwood Dibbles [phone]) on 09/29/2021 2:21:00 PM  Radiology CT Head Wo Contrast  Result Date: 09/29/2021 CLINICAL DATA:  Mental status change.  Unknown cause. EXAM: CT HEAD WITHOUT CONTRAST TECHNIQUE: Contiguous axial images were obtained from the base of the skull through the vertex without intravenous contrast. RADIATION DOSE REDUCTION: This exam was performed according to the departmental dose-optimization program which includes  automated exposure control, adjustment of the mA and/or kV according to patient size and/or use of iterative reconstruction technique. COMPARISON:  CT head 01/30/2008. FINDINGS: Brain: There is no evidence of acute intracranial hemorrhage, mass lesion, brain edema or extra-axial fluid collection. Progressive atrophy with prominence of the ventricles and subarachnoid spaces. Patchy low-density in the periventricular white matter most consistent  with chronic small vessel ischemic changes. There is no CT evidence of acute cortical infarction. Vascular:  No hyperdense vessel identified. Skull: Negative for fracture or focal lesion. Sinuses/Orbits: The visualized paranasal sinuses and mastoid air cells are clear. No orbital abnormalities are seen. Other: None. IMPRESSION: No acute intracranial findings. Mildly progressive atrophy and chronic small vessel ischemic changes in the periventricular white matter. Electronically Signed   By: Carey Bullocks M.D.   On: 09/29/2021 14:38    Procedures Procedures  {Document cardiac monitor, telemetry assessment procedure when appropriate:1}  Medications Ordered in ED Medications  thiamine (B-1) injection 100 mg (100 mg Intravenous Given 09/29/21 1352)  sodium chloride 0.9 % bolus 2,000 mL (has no administration in time range)    Followed by  0.9 %  sodium chloride infusion (has no administration in time range)  LORazepam (ATIVAN) injection 1 mg (1 mg Intravenous Given 09/29/21 1356)    ED Course/ Medical Decision Making/ A&P Clinical Course as of 09/29/21 1503  Fri Sep 29, 2021  1500 Acetaminophen level(!) Normal [JK]  1501 Salicylate level(!) Normal [JK]  1501 Comprehensive metabolic panel(!) Calcium level increased, protein level increased [JK]  1501 CBC with Differential(!) White blood cells hemoglobin level increased questional component hemoconcentration [JK]  1502 Ammonia Normal [JK]  1502 Lactic acid, plasma Normal [JK]    Clinical Course User  Index [JK] Linwood Dibbles, MD                           Medical Decision Making Differential diagnosis includes but not limited to alcohol or drug use, alcohol withdrawal, Warnicke Korsakoff encephalopathy, metabolic derangement, CNS abnormality  Amount and/or Complexity of Data Reviewed Independent Historian:     Details: Additional history by patient's daughter.  She states that has had issues with alcohol abuse as well as taking excessive medications, Ambien.  She is concerned that this is happened again. External Data Reviewed: notes.    Details: Outside records reviewed.  Patient called behavioral health on June 15 admitting to alcohol relapse earlier in the month but had been sober for 10 days.  Therapist tried to reach out to her on July 3 but she did not return the call Labs: ordered. Decision-making details documented in ED Course. Radiology: ordered.  Risk Prescription drug management.   ***  {Document critical care time when appropriate:1} {Document review of labs and clinical decision tools ie heart score, Chads2Vasc2 etc:1}  {Document your independent review of radiology images, and any outside records:1} {Document your discussion with family members, caretakers, and with consultants:1} {Document social determinants of health affecting pt's care:1} {Document your decision making why or why not admission, treatments were needed:1} Final Clinical Impression(s) / ED Diagnoses Final diagnoses:  None    Rx / DC Orders ED Discharge Orders     None

## 2021-09-30 DIAGNOSIS — K219 Gastro-esophageal reflux disease without esophagitis: Secondary | ICD-10-CM | POA: Diagnosis not present

## 2021-09-30 DIAGNOSIS — F1010 Alcohol abuse, uncomplicated: Secondary | ICD-10-CM | POA: Diagnosis not present

## 2021-09-30 DIAGNOSIS — F32A Depression, unspecified: Secondary | ICD-10-CM

## 2021-09-30 DIAGNOSIS — G9341 Metabolic encephalopathy: Secondary | ICD-10-CM | POA: Diagnosis not present

## 2021-09-30 LAB — COMPREHENSIVE METABOLIC PANEL
ALT: 21 U/L (ref 0–44)
AST: 30 U/L (ref 15–41)
Albumin: 3.4 g/dL — ABNORMAL LOW (ref 3.5–5.0)
Alkaline Phosphatase: 54 U/L (ref 38–126)
Anion gap: 8 (ref 5–15)
BUN: 7 mg/dL (ref 6–20)
CO2: 22 mmol/L (ref 22–32)
Calcium: 8.6 mg/dL — ABNORMAL LOW (ref 8.9–10.3)
Chloride: 110 mmol/L (ref 98–111)
Creatinine, Ser: 0.75 mg/dL (ref 0.44–1.00)
GFR, Estimated: >60 mL/min (ref >60)
Glucose, Bld: 114 mg/dL — ABNORMAL HIGH (ref 70–99)
Potassium: 3.3 mmol/L — ABNORMAL LOW (ref 3.5–5.1)
Sodium: 140 mmol/L (ref 135–145)
Total Bilirubin: 0.8 mg/dL (ref 0.3–1.2)
Total Protein: 5.6 g/dL — ABNORMAL LOW (ref 6.5–8.1)

## 2021-09-30 LAB — URINE CULTURE: Culture: <10000 — AB

## 2021-09-30 LAB — CBC
HCT: 37.5 % (ref 36.0–46.0)
Hemoglobin: 12.8 g/dL (ref 12.0–15.0)
MCH: 35.4 pg — ABNORMAL HIGH (ref 26.0–34.0)
MCHC: 34.1 g/dL (ref 30.0–36.0)
MCV: 103.6 fL — ABNORMAL HIGH (ref 80.0–100.0)
Platelets: 154 10*3/uL (ref 150–400)
RBC: 3.62 MIL/uL — ABNORMAL LOW (ref 3.87–5.11)
RDW: 12.2 % (ref 11.5–15.5)
WBC: 5.5 10*3/uL (ref 4.0–10.5)
nRBC: 0 % (ref 0.0–0.2)

## 2021-09-30 LAB — URINALYSIS, ROUTINE W REFLEX MICROSCOPIC
Bilirubin Urine: NEGATIVE
Glucose, UA: NEGATIVE mg/dL
Hgb urine dipstick: NEGATIVE
Ketones, ur: NEGATIVE mg/dL
Leukocytes,Ua: NEGATIVE
Nitrite: NEGATIVE
Protein, ur: NEGATIVE mg/dL
Specific Gravity, Urine: 1.004 — ABNORMAL LOW (ref 1.005–1.030)
pH: 6 (ref 5.0–8.0)

## 2021-09-30 LAB — FOLATE: Folate: 21.2 ng/mL (ref >5.9)

## 2021-09-30 LAB — GLUCOSE, CAPILLARY: Glucose-Capillary: 121 mg/dL — ABNORMAL HIGH (ref 70–99)

## 2021-09-30 LAB — TSH: TSH: 1.089 u[IU]/mL (ref 0.350–4.500)

## 2021-09-30 LAB — RPR: RPR Ser Ql: NONREACTIVE

## 2021-09-30 LAB — MAGNESIUM: Magnesium: 1.6 mg/dL — ABNORMAL LOW (ref 1.7–2.4)

## 2021-09-30 MED ORDER — SODIUM CHLORIDE 0.9 % IV SOLN
1000.0000 mL | INTRAVENOUS | Status: DC
  Administered 2021-09-30 – 2021-10-01 (×2): 1000 mL via INTRAVENOUS

## 2021-09-30 MED ORDER — PNEUMOCOCCAL 20-VAL CONJ VACC 0.5 ML IM SUSY
0.5000 mL | PREFILLED_SYRINGE | INTRAMUSCULAR | Status: AC
  Administered 2021-10-02: 0.5 mL via INTRAMUSCULAR
  Filled 2021-09-30: qty 0.5

## 2021-09-30 MED ORDER — MAGNESIUM SULFATE 4 GM/100ML IV SOLN
4.0000 g | Freq: Once | INTRAVENOUS | Status: AC
Start: 2021-09-30 — End: 2021-10-01
  Administered 2021-09-30: 4 g via INTRAVENOUS
  Filled 2021-09-30: qty 100

## 2021-09-30 MED ORDER — ORAL CARE MOUTH RINSE: 15.0000 mL | OROMUCOSAL | Status: DC | PRN

## 2021-09-30 MED ORDER — POTASSIUM CHLORIDE CRYS ER 20 MEQ PO TBCR
40.0000 meq | EXTENDED_RELEASE_TABLET | Freq: Once | ORAL | Status: AC
  Administered 2021-09-30: 40 meq via ORAL
  Filled 2021-09-30: qty 2

## 2021-09-30 MED ORDER — ENSURE ENLIVE PO LIQD
237.0000 mL | Freq: Two times a day (BID) | ORAL | Status: DC
  Administered 2021-09-30 – 2021-10-02 (×6): 237 mL via ORAL

## 2021-09-30 NOTE — Progress Notes (Signed)
PROGRESS NOTE    Terri Maxwell  [id number] DOB: November 11, 1965 DOA: 09/29/2021 PCP: System, Provider Not In    Chief Complaint  Patient presents with   Altered Mental Status    Brief Narrative:  Patient 56 year old female history of ADHD, alcohol abuse ongoing, depression, GERD presented to the ED with confusion, disorientation and altered mental status.  Patient noted to have recent hospitalization for similar symptoms.  Patient admitted and being treated for dehydration, possible alcohol withdrawal, possible polypharmacy.   Assessment & Plan:   Principal Problem:   Acute metabolic encephalopathy Active Problems:   Alcohol abuse   Depression   Hypokalemia   Hypercalcemia   HTN (hypertension)   GERD (gastroesophageal reflux disease)   Hypomagnesemia  #1 acute metabolic encephalopathy -Patient presented with confusion, disorientation, noted to be rambling. -Differential diagnosis likely multifactorial secondary to dehydration, possibility of alcohol withdrawal, probable polypharmacy/increased sleeping medications as per daughter patient has a history of taking lots of sleeping pills. -Patient received 2 L bolus of IV fluids in the ED placed on normal saline. -At time of admission patient with clinical improvement after fluid resuscitation.   -Chest x-ray with no acute abnormalities. -Urinalysis nitrite negative, leukocytes negative. -Salicylate level negative, alcohol level negative, Tylenol level negative. -UDS positive for benzos, THC. -Continue IV fluid resuscitation, Librium detox protocol.   -Continue to hold Ambien and sedative medications.   -Likely will not resume Ambien on discharge.   -Supportive care.    2.  Dehydration -Decrease IV fluids to 75 cc/h  3.  Hypercalcemia -Likely secondary to dehydration. -Repeat labs after fluid resuscitation with improvement with hypercalcemia. -Follow.  4.  GERD -PPI.  5.  Hypokalemia/hypomagnesemia -Potassium at 3.3  this morning, magnesium of 1.6. -Magnesium sulfate 4 g IV x1.  K-Dur 40 mEq p.o. x1 -Repeat labs in the AM.  6.  History of alcohol abuse -Patient denies any recent history of alcohol use stating last alcohol use was 2 weeks prior to admission however daughter does state patient was drinking 2 days prior to admission. -Patient with some tremors noted on examination today.   -Continue Librium detox protocol, thiamine, multivitamin, folic acid.  -TOC consultation placed.   7. ?  Hypertension -Patient not on any antihypertensive medications prior to admission.   -Patient started on Norvasc 5 mg daily and uptitrate as needed for better blood pressure control.     DVT prophylaxis: Lovenox Code Status: Full Family Communication: Updated patient.  No family at bedside. Disposition: TBD  Status is: Observation The patient remains OBS appropriate and will d/c before 2 midnights.   Consultants:  None  Procedures:  CT head 09/29/2021 Chest x-ray 09/29/2021    Antimicrobials:  None   Subjective: Patient sitting up in chair.  Patient with some slight tremors, tearful.  Overall feeling better.  Tolerating diet.  No chest pain.  No shortness of breath.  No abdominal pain.  RN at bedside trying to place new IV  Objective: Vitals:   09/30/21 0505 09/30/21 0719 09/30/21 1100 09/30/21 1529  BP:  133/72 (!) 147/84 (!) 143/85  Pulse:  80 66 73  Resp:  16 18 18   Temp:  98.3 F (36.8 C) 97.6 F (36.4 C) (!) 97.5 F (36.4 C)  TempSrc:  Oral Oral Oral  SpO2:  96% 98% 100%  Weight: 69.5 kg     Height:        Intake/Output Summary (Last 24 hours) at 09/30/2021 1654 Last data filed at 09/30/2021 0854 Gross per 24  hour  Intake 1414.68 ml  Output 900 ml  Net 514.68 ml   Filed Weights   09/29/21 2008 09/30/21 0505  Weight: 67.3 kg 69.5 kg    Examination:  General exam: Appears calm and comfortable.  Tremors noted. Respiratory system: Clear to auscultation.  No wheezes, no crackles, no  rhonchi.  Respiratory effort normal. Cardiovascular system: S1 & S2 heard, RRR. No JVD, murmurs, rubs, gallops or clicks. No pedal edema. Gastrointestinal system: Abdomen is nondistended, soft and nontender. No organomegaly or masses felt. Normal bowel sounds heard. Central nervous system: Alert and oriented. No focal neurological deficits. Extremities: Symmetric 5 x 5 power. Skin: No rashes, lesions or ulcers Psychiatry: Judgement and insight appear normal. Mood & affect appropriate.     Data Reviewed: I have personally reviewed following labs and imaging studies  CBC: Recent Labs  Lab 09/29/21 1225 09/30/21 0215  WBC 12.4* 5.5  NEUTROABS 9.3*  --   HGB 15.9* 12.8  HCT 46.2* 37.5  MCV 103.6* 103.6*  PLT 246 154    Basic Metabolic Panel: Recent Labs  Lab 09/29/21 1225 09/29/21 1649 09/30/21 0215  NA 138 141 140  K 4.0 3.1* 3.3*  CL 100 108 110  CO2 24 21* 22  GLUCOSE 122* 96 114*  BUN 12 11 7   CREATININE 1.09* 0.85 0.75  CALCIUM 11.0* 8.5* 8.6*  MG  --  1.6* 1.6*  PHOS  --  3.5  --     GFR: Estimated Creatinine Clearance: 72.5 mL/min (by C-G formula based on SCr of 0.75 mg/dL).  Liver Function Tests: Recent Labs  Lab 09/29/21 1225 09/30/21 0215  AST 42* 30  ALT 32 21  ALKPHOS 80 54  BILITOT 1.4* 0.8  PROT 8.3* 5.6*  ALBUMIN 5.2* 3.4*    CBG: Recent Labs  Lab 09/29/21 1728 09/30/21 0849  GLUCAP 98 121*     Recent Results (from the past 240 hour(s))  Urine Culture     Status: Abnormal   Collection Time: 09/29/21  4:39 PM   Specimen: Urine, Catheterized  Result Value Ref Range Status   Specimen Description URINE, CATHETERIZED  Final   Special Requests NONE  Final   Culture (A)  Final    <10,000 COLONIES/mL INSIGNIFICANT GROWTH Performed at [facility], [address] [place], [place]    Report Status 09/30/2021 FINAL  Final  SARS Coronavirus 2 by RT PCR (hospital order, performed in [facility] hospital lab) *cepheid  single result test* Anterior Nasal Swab     Status: None   Collection Time: 09/29/21  7:20 PM   Specimen: Anterior Nasal Swab  Result Value Ref Range Status   SARS Coronavirus 2 by RT PCR NEGATIVE NEGATIVE Final    Comment: (NOTE) SARS-CoV-2 target nucleic acids are NOT DETECTED.  The SARS-CoV-2 RNA is generally detectable in upper and lower respiratory specimens during the acute phase of infection. The lowest concentration of SARS-CoV-2 viral copies this assay can detect is 250 copies / mL. A negative result does not preclude SARS-CoV-2 infection and should not be used as the sole basis for treatment or other patient management decisions.  A negative result may occur with improper specimen collection / handling, submission of specimen other than nasopharyngeal swab, presence of viral mutation(s) within the areas targeted by this assay, and inadequate number of viral copies (<250 copies / mL). A negative result must be combined with clinical observations, patient history, and epidemiological information.  Fact Sheet for Patients:  https://www.patel.info/  Fact Sheet for Healthcare Providers: https://hall.com/  This test is not yet approved or  cleared by the [place] FDA and has been authorized for detection and/or diagnosis of SARS-CoV-2 by FDA under an Emergency Use Authorization (EUA).  This EUA will remain in effect (meaning this test can be used) for the duration of the COVID-19 declaration under Section 564(b)(1) of the Act, 21 U.S.C. section 360bbb-3(b)(1), unless the authorization is terminated or revoked sooner.  Performed at [facility], [place] [address], [place], [place] [address]          Radiology Studies: DG Chest 2 View  Result Date: 09/29/2021 CLINICAL DATA:  Confusion. EXAM: CHEST - 2 VIEW COMPARISON:  None Available. FINDINGS: The cardiomediastinal contours are normal. The lungs are clear. Pulmonary  vasculature is normal. No consolidation, pleural effusion, or pneumothorax. No acute osseous abnormalities are seen. IMPRESSION: Negative radiographs of the chest. Electronically Signed   By: Keith Rake M.D.   On: 09/29/2021 19:46   CT Head Wo Contrast  Result Date: 09/29/2021 CLINICAL DATA:  Mental status change.  Unknown cause. EXAM: CT HEAD WITHOUT CONTRAST TECHNIQUE: Contiguous axial images were obtained from the base of the skull through the vertex without intravenous contrast. RADIATION DOSE REDUCTION: This exam was performed according to the departmental dose-optimization program which includes automated exposure control, adjustment of the mA and/or kV according to patient size and/or use of iterative reconstruction technique. COMPARISON:  CT head 01/30/2008. FINDINGS: Brain: There is no evidence of acute intracranial hemorrhage, mass lesion, brain edema or extra-axial fluid collection. Progressive atrophy with prominence of the ventricles and subarachnoid spaces. Patchy low-density in the periventricular white matter most consistent with chronic small vessel ischemic changes. There is no CT evidence of acute cortical infarction. Vascular:  No hyperdense vessel identified. Skull: Negative for fracture or focal lesion. Sinuses/Orbits: The visualized paranasal sinuses and mastoid air cells are clear. No orbital abnormalities are seen. Other: None. IMPRESSION: No acute intracranial findings. Mildly progressive atrophy and chronic small vessel ischemic changes in the periventricular white matter. Electronically Signed   By: Richardean Sale M.D.   On: 09/29/2021 14:38        Scheduled Meds:  amLODipine  5 mg Oral Daily   chlordiazePOXIDE  25 mg Oral QID   Followed by   Derrill Memo ON 10/01/2021] chlordiazePOXIDE  25 mg Oral TID   Followed by   Derrill Memo ON 10/02/2021] chlordiazePOXIDE  25 mg Oral BH-qamhs   Followed by   Derrill Memo ON 10/03/2021] chlordiazePOXIDE  25 mg Oral Daily   enoxaparin (LOVENOX)  injection  40 mg Subcutaneous Q24H   feeding supplement  237 mL Oral BID BM   fluticasone  2 spray Each Nare Daily   loratadine  10 mg Oral Daily   methylphenidate  54 mg Oral q morning   multivitamin with minerals  1 tablet Oral Daily   pantoprazole  40 mg Oral QAC breakfast   [START ON 10/01/2021] pneumococcal 20-valent conjugate vaccine  0.5 mL Intramuscular Tomorrow-1000   sodium chloride flush  3 mL Intravenous Q12H   thiamine injection  100 mg Intravenous Daily   thiamine  100 mg Intramuscular Once   thiamine  100 mg Oral Daily   cyanocobalamin  1,000 mcg Oral Daily   Continuous Infusions:  sodium chloride       LOS: 0 days    Time spent: 35 minutes    Irine Seal, MD [facility]   To contact the attending provider between  7A-7P or the covering provider during after hours 7P-7A, please log into the web site [email or web address] and access using universal [place] password for that web site. If you do not have the password, please call the hospital operator.  09/30/2021, 4:54 PM
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
## 2021-09-30 NOTE — Discharge Instructions (Signed)
? ?  ? ?  Outpatient Psychiatry and Counseling ? ?[facility]: Mobile Crisis Management:  [phone] ? ?[facility]         ?[address] ?[place], [place] [address] ?[phone] ? ?[facility] sliding scale fee and walk in schedule: M-F 8am-12pm/1pm-3pm ?[address] ?[place], [place] [address] ?[phone] ? ?[facility]/ Intensive Outpatient Therapy Program ?[address] ?[place], [place] [address] ?[phone] ? ?[facility]   [facility] ?[address]   [address] ?[place], [place] [address]    [place], [place] [address] ?[phone]     [phone] ? ?[facility]               [facility] ?[address]  [address] ?[place] [address]    [place] [address] ?[phone]     [phone] ? ?Parish McKinney, MD    [facility] ?[address]    [address] ?[place] [address]    [place] [address] ?[phone]       [phone] ? ?[facility]   [facility] ?[address]   [address] ?[place]     [place], [place] ?[phone]     [phone] ? ?[facility]    [facility] ?[address]    Shamsher Ahluwalia, MD ?[place], [place]                  [address] ?[phone]     , [place] [address] ?[phone] ?[facility]: (Spanish speakin) ?[phone] ? ?[facility]    [facility] ?[address]    [address] ?,  [address]    ,  [address] ?[phone]     [phone]  ?

## 2021-09-30 NOTE — Plan of Care (Signed)
Pt arrived to 3W from ER via stretcher. Able to stand and walk from stretcher in hallway to bed in 3W room; gait slow but steady. Vitals obtained and pt assessed. See flowsheets. Pt oriented to staff and room. Daughter at bedside for a couple hours then left. Pt and daughter updated on plan of care.   Problem: Clinical Measurements: Goal: Ability to maintain clinical measurements within normal limits will improve Outcome: Progressing Goal: Will remain free from infection Outcome: Progressing Goal: Diagnostic test results will improve Outcome: Progressing Goal: Respiratory complications will improve Outcome: Progressing Goal: Cardiovascular complication will be avoided Outcome: Progressing   Problem: Activity: Goal: Risk for activity intolerance will decrease Outcome: Progressing
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
## 2021-10-01 DIAGNOSIS — K219 Gastro-esophageal reflux disease without esophagitis: Secondary | ICD-10-CM | POA: Diagnosis not present

## 2021-10-01 DIAGNOSIS — F1010 Alcohol abuse, uncomplicated: Secondary | ICD-10-CM | POA: Diagnosis not present

## 2021-10-01 DIAGNOSIS — G9341 Metabolic encephalopathy: Secondary | ICD-10-CM | POA: Diagnosis not present

## 2021-10-01 DIAGNOSIS — F32A Depression, unspecified: Secondary | ICD-10-CM | POA: Diagnosis not present

## 2021-10-01 LAB — CBC
HCT: 38.5 % (ref 36.0–46.0)
Hemoglobin: 13.7 g/dL (ref 12.0–15.0)
MCH: 35.8 pg — ABNORMAL HIGH (ref 26.0–34.0)
MCHC: 35.6 g/dL (ref 30.0–36.0)
MCV: 100.5 fL — ABNORMAL HIGH (ref 80.0–100.0)
Platelets: 128 10*3/uL — ABNORMAL LOW (ref 150–400)
RBC: 3.83 MIL/uL — ABNORMAL LOW (ref 3.87–5.11)
RDW: 11.9 % (ref 11.5–15.5)
WBC: 3.9 10*3/uL — ABNORMAL LOW (ref 4.0–10.5)
nRBC: 0 % (ref 0.0–0.2)

## 2021-10-01 LAB — GLUCOSE, CAPILLARY: Glucose-Capillary: 110 mg/dL — ABNORMAL HIGH (ref 70–99)

## 2021-10-01 LAB — BASIC METABOLIC PANEL
Anion gap: 12 (ref 5–15)
BUN: <5 mg/dL — ABNORMAL LOW (ref 6–20)
CO2: 20 mmol/L — ABNORMAL LOW (ref 22–32)
Calcium: 8.5 mg/dL — ABNORMAL LOW (ref 8.9–10.3)
Chloride: 104 mmol/L (ref 98–111)
Creatinine, Ser: 0.57 mg/dL (ref 0.44–1.00)
GFR, Estimated: >60 mL/min (ref >60)
Glucose, Bld: 137 mg/dL — ABNORMAL HIGH (ref 70–99)
Potassium: 3.4 mmol/L — ABNORMAL LOW (ref 3.5–5.1)
Sodium: 136 mmol/L (ref 135–145)

## 2021-10-01 LAB — MAGNESIUM: Magnesium: 2.1 mg/dL (ref 1.7–2.4)

## 2021-10-01 MED ORDER — POTASSIUM CHLORIDE CRYS ER 10 MEQ PO TBCR
40.0000 meq | EXTENDED_RELEASE_TABLET | Freq: Once | ORAL | Status: AC
  Administered 2021-10-01: 40 meq via ORAL
  Filled 2021-10-01: qty 4

## 2021-10-01 MED ORDER — MELATONIN 3 MG PO TABS
3.0000 mg | ORAL_TABLET | Freq: Every evening | ORAL | Status: DC | PRN
  Administered 2021-10-01 – 2021-10-02 (×2): 3 mg via ORAL
  Filled 2021-10-01 (×2): qty 1

## 2021-10-01 NOTE — Progress Notes (Signed)
PROGRESS NOTE    Terri Maxwell  WFU:[id number] DOB: 29-Sep-1965 DOA: 09/29/2021 PCP: System, Provider Not In    Chief Complaint  Patient presents with   Altered Mental Status    Brief Narrative:  Patient 56 year old female history of ADHD, alcohol abuse ongoing, depression, GERD presented to the ED with confusion, disorientation and altered mental status.  Patient noted to have recent hospitalization for similar symptoms.  Patient admitted and being treated for dehydration, possible alcohol withdrawal, possible polypharmacy.   Assessment & Plan:   Principal Problem:   Acute metabolic encephalopathy Active Problems:   Alcohol abuse   Depression   Hypokalemia   Hypercalcemia   HTN (hypertension)   GERD (gastroesophageal reflux disease)   Hypomagnesemia  #1 acute metabolic encephalopathy -Patient presented with confusion, disorientation, noted to be rambling. -Differential diagnosis likely multifactorial secondary to dehydration, possibility of alcohol withdrawal, probable polypharmacy/increased sleeping medications as per daughter patient has a history of taking lots of sleeping pills. -Patient received 2 L bolus of IV fluids in the ED placed on normal saline. -At time of admission patient with clinical improvement after fluid resuscitation.   -Chest x-ray with no acute abnormalities. -Urinalysis nitrite negative, leukocytes negative. -Salicylate level negative, alcohol level negative, Tylenol level negative. -UDS positive for benzos, THC. -Saline lock IV fluids.   -Continue Librium detox protocol.  -Continue to hold Ambien and sedative medications.   -Likely will not resume Ambien on discharge.   -Supportive care.    2.  Dehydration -Saline lock IV fluids.   3.  Hypercalcemia -Likely secondary to dehydration. -Repeat labs after fluid resuscitation with improvement with hypercalcemia. -Follow.  4.  GERD -Continue PPI.  5.  Hypokalemia/hypomagnesemia -Potassium  at 3.4 this morning, magnesium of 2.1. - Kdur 40 mEq p.o. x1. -Repeat labs in the AM.  6.  History of alcohol abuse -Patient denies any recent history of alcohol use stating last alcohol use was 2 weeks prior to admission however daughter does state patient was drinking 2 days prior to admission. -Patient with improving tremors.   -Continue Librium detox protocol, thiamine, multivitamin, folic acid.   -TOC consultation placed.    7. ?  Hypertension -Patient not on any antihypertensive medications prior to admission.   -Blood pressure improved on current regimen of Norvasc 5 mg daily.    DVT prophylaxis: Lovenox Code Status: Full Family Communication: Updated patient.  No family at bedside. Disposition: TBD  Status is: Observation The patient remains OBS appropriate and will d/c before 2 midnights.   Consultants:  None  Procedures:  CT head 09/29/2021 Chest x-ray 09/29/2021    Antimicrobials:  None   Subjective: Laying in bed.  Stated unable to sleep well last night as her mind was racing trying to figure out living situation when she leaves the hospital.  Feels tremor is improving.  No chest pain.  No shortness of breath.  Still with some poor to moderate oral intake.   Objective: Vitals:   09/30/21 1952 09/30/21 2356 10/01/21 0607 10/01/21 0816  BP: 125/67 130/74 (!) 149/88 123/77  Pulse: 68 64 66 (!) 58  Resp: 16 16 16 16   Temp: 98.4 F (36.9 C) 97.9 F (36.6 C) 98.1 F (36.7 C) 98 F (36.7 C)  TempSrc: Oral Oral Oral Oral  SpO2: 100% 98% 97% 98%  Weight:   64 kg   Height:        Intake/Output Summary (Last 24 hours) at 10/01/2021 0957 Last data filed at 10/01/2021 0948 Gross per 24  hour  Intake 1316.52 ml  Output --  Net 1316.52 ml    Filed Weights   09/29/21 2008 09/30/21 0505 10/01/21 0607  Weight: 67.3 kg 69.5 kg 64 kg    Examination:  General exam: NAD.  Improving tremors.  Respiratory system: CTA B.  No wheezes, no crackles, no rhonchi.  Fair  air movement.  Speaking in full sentences.  Cardiovascular system: Regular rate rhythm no murmurs rubs or gallops.  No JVD.  No lower extremity edema.   Gastrointestinal system: Abdomen is soft, nontender, nondistended, positive bowel sounds.  No rebound.  No guarding. Central nervous system: Alert and oriented. No focal neurological deficits. Extremities: Symmetric 5 x 5 power. Skin: No rashes, lesions or ulcers Psychiatry: Judgement and insight appear normal. Mood & affect appropriate.     Data Reviewed: I have personally reviewed following labs and imaging studies  CBC: Recent Labs  Lab 09/29/21 1225 09/30/21 0215 10/01/21 0116  WBC 12.4* 5.5 3.9*  NEUTROABS 9.3*  --   --   HGB 15.9* 12.8 13.7  HCT 46.2* 37.5 38.5  MCV 103.6* 103.6* 100.5*  PLT 246 154 128*     Basic Metabolic Panel: Recent Labs  Lab 09/29/21 1225 09/29/21 1649 09/30/21 0215 10/01/21 0116  NA 138 141 140 136  K 4.0 3.1* 3.3* 3.4*  CL 100 108 110 104  CO2 24 21* 22 20*  GLUCOSE 122* 96 114* 137*  BUN 12 11 7  <5*  CREATININE 1.09* 0.85 0.75 0.57  CALCIUM 11.0* 8.5* 8.6* 8.5*  MG  --  1.6* 1.6* 2.1  PHOS  --  3.5  --   --      GFR: Estimated Creatinine Clearance: 69.7 mL/min (by C-G formula based on SCr of 0.57 mg/dL).  Liver Function Tests: Recent Labs  Lab 09/29/21 1225 09/30/21 0215  AST 42* 30  ALT 32 21  ALKPHOS 80 54  BILITOT 1.4* 0.8  PROT 8.3* 5.6*  ALBUMIN 5.2* 3.4*     CBG: Recent Labs  Lab 09/29/21 1728 09/30/21 0849 10/01/21 0814  GLUCAP 98 121* 110*      Recent Results (from the past 240 hour(s))  Urine Culture     Status: Abnormal   Collection Time: 09/29/21  4:39 PM   Specimen: Urine, Catheterized  Result Value Ref Range Status   Specimen Description URINE, CATHETERIZED  Final   Special Requests NONE  Final   Culture (A)  Final    <10,000 COLONIES/mL INSIGNIFICANT GROWTH Performed at [facility], [address], [place], [place]     Report Status 09/30/2021 FINAL  Final  SARS Coronavirus 2 by RT PCR (hospital order, performed in [facility] hospital lab) *cepheid single result test* Anterior Nasal Swab     Status: None   Collection Time: 09/29/21  7:20 PM   Specimen: Anterior Nasal Swab  Result Value Ref Range Status   SARS Coronavirus 2 by RT PCR NEGATIVE NEGATIVE Final    Comment: (NOTE) SARS-CoV-2 target nucleic acids are NOT DETECTED.  The SARS-CoV-2 RNA is generally detectable in upper and lower respiratory specimens during the acute phase of infection. The lowest concentration of SARS-CoV-2 viral copies this assay can detect is 250 copies / mL. A negative result does not preclude SARS-CoV-2 infection and should not be used as the sole basis for treatment or other patient management decisions.  A negative result may occur with improper specimen collection / handling, submission of specimen other than nasopharyngeal swab, presence of  viral mutation(s) within the areas targeted by this assay, and inadequate number of viral copies (<250 copies / mL). A negative result must be combined with clinical observations, patient history, and epidemiological information.  Fact Sheet for Patients:   RoadLapTop.co.za  Fact Sheet for Healthcare Providers: http://kim-miller.com/  This test is not yet approved or  cleared by the [place] FDA and has been authorized for detection and/or diagnosis of SARS-CoV-2 by FDA under an Emergency Use Authorization (EUA).  This EUA will remain in effect (meaning this test can be used) for the duration of the COVID-19 declaration under Section 564(b)(1) of the Act, 21 U.S.C. section 360bbb-3(b)(1), unless the authorization is terminated or revoked sooner.  Performed at [facility], [address], [place], [place] [address]          Radiology Studies: DG Chest 2 View  Result Date: 09/29/2021 CLINICAL DATA:  Confusion.  EXAM: CHEST - 2 VIEW COMPARISON:  None Available. FINDINGS: The cardiomediastinal contours are normal. The lungs are clear. Pulmonary vasculature is normal. No consolidation, pleural effusion, or pneumothorax. No acute osseous abnormalities are seen. IMPRESSION: Negative radiographs of the chest. Electronically Signed   By: Narda Rutherford M.D.   On: 09/29/2021 19:46   CT Head Wo Contrast  Result Date: 09/29/2021 CLINICAL DATA:  Mental status change.  Unknown cause. EXAM: CT HEAD WITHOUT CONTRAST TECHNIQUE: Contiguous axial images were obtained from the base of the skull through the vertex without intravenous contrast. RADIATION DOSE REDUCTION: This exam was performed according to the departmental dose-optimization program which includes automated exposure control, adjustment of the mA and/or kV according to patient size and/or use of iterative reconstruction technique. COMPARISON:  CT head 01/30/2008. FINDINGS: Brain: There is no evidence of acute intracranial hemorrhage, mass lesion, brain edema or extra-axial fluid collection. Progressive atrophy with prominence of the ventricles and subarachnoid spaces. Patchy low-density in the periventricular white matter most consistent with chronic small vessel ischemic changes. There is no CT evidence of acute cortical infarction. Vascular:  No hyperdense vessel identified. Skull: Negative for fracture or focal lesion. Sinuses/Orbits: The visualized paranasal sinuses and mastoid air cells are clear. No orbital abnormalities are seen. Other: None. IMPRESSION: No acute intracranial findings. Mildly progressive atrophy and chronic small vessel ischemic changes in the periventricular white matter. Electronically Signed   By: Carey Bullocks M.D.   On: 09/29/2021 14:38        Scheduled Meds:  amLODipine  5 mg Oral Daily   chlordiazePOXIDE  25 mg Oral QID   Followed by   chlordiazePOXIDE  25 mg Oral TID   Followed by   Melene Muller ON 10/02/2021] chlordiazePOXIDE  25 mg  Oral BH-qamhs   Followed by   Melene Muller ON 10/03/2021] chlordiazePOXIDE  25 mg Oral Daily   enoxaparin (LOVENOX) injection  40 mg Subcutaneous Q24H   feeding supplement  237 mL Oral BID BM   fluticasone  2 spray Each Nare Daily   loratadine  10 mg Oral Daily   methylphenidate  54 mg Oral q morning   multivitamin with minerals  1 tablet Oral Daily   pantoprazole  40 mg Oral QAC breakfast   pneumococcal 20-valent conjugate vaccine  0.5 mL Intramuscular Tomorrow-1000   sodium chloride flush  3 mL Intravenous Q12H   thiamine injection  100 mg Intravenous Daily   thiamine  100 mg Intramuscular Once   thiamine  100 mg Oral Daily   cyanocobalamin  1,000 mcg Oral Daily   Continuous Infusions:  LOS: 0 days    Time spent: 35 minutes    Ramiro Harvest, MD [facility]   To contact the attending provider between 7A-7P or the covering provider during after hours 7P-7A, please log into the web site [email or web address] and access using universal [place] password for that web site. If you do not have the password, please call the hospital operator.  10/01/2021, 9:57 AM

## 2021-10-01 NOTE — TOC CAGE-AID Note (Signed)
Transition of Care [facility]) - CAGE-AID Screening   Patient Details  Name: Terri Maxwell MRN: [id number] Date of Birth: [birth year]  [facility]) CM/SW Contact:    Kermit Balo, RN Phone Number: 10/01/2021, 4:08 PM   Clinical Narrative: Pt is interested in outpatient counseling. CM provided her with resources for inpatient/ outpatient counseling and will f/u with her in the am.   CAGE-AID Screening:    Have You Ever Felt You Ought to Cut Down on Your Drinking or Drug Use?: Yes Have People Annoyed You By Critizing Your Drinking Or Drug Use?: Yes Have You Felt Bad Or Guilty About Your Drinking Or Drug Use?: Yes Have You Ever Had a Drink or Used Drugs First Thing In The Morning to Steady Your Nerves or to Get Rid of a Hangover?: No CAGE-AID Score: 3  Substance Abuse Education Offered: Yes (provided)  Substance abuse interventions: Patient Counseling, Transport planner
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
## 2021-10-02 DIAGNOSIS — F909 Attention-deficit hyperactivity disorder, unspecified type: Secondary | ICD-10-CM | POA: Diagnosis present

## 2021-10-02 DIAGNOSIS — Z23 Encounter for immunization: Secondary | ICD-10-CM | POA: Diagnosis not present

## 2021-10-02 DIAGNOSIS — F1010 Alcohol abuse, uncomplicated: Secondary | ICD-10-CM | POA: Diagnosis not present

## 2021-10-02 DIAGNOSIS — R4182 Altered mental status, unspecified: Secondary | ICD-10-CM | POA: Diagnosis present

## 2021-10-02 DIAGNOSIS — Z880 Allergy status to penicillin: Secondary | ICD-10-CM | POA: Diagnosis not present

## 2021-10-02 DIAGNOSIS — Z20822 Contact with and (suspected) exposure to covid-19: Secondary | ICD-10-CM | POA: Diagnosis present

## 2021-10-02 DIAGNOSIS — Z9183 Wandering in diseases classified elsewhere: Secondary | ICD-10-CM | POA: Diagnosis not present

## 2021-10-02 DIAGNOSIS — K219 Gastro-esophageal reflux disease without esophagitis: Secondary | ICD-10-CM | POA: Diagnosis present

## 2021-10-02 DIAGNOSIS — Z87891 Personal history of nicotine dependence: Secondary | ICD-10-CM | POA: Diagnosis not present

## 2021-10-02 DIAGNOSIS — E876 Hypokalemia: Secondary | ICD-10-CM | POA: Diagnosis present

## 2021-10-02 DIAGNOSIS — Z885 Allergy status to narcotic agent status: Secondary | ICD-10-CM | POA: Diagnosis not present

## 2021-10-02 DIAGNOSIS — F10130 Alcohol abuse with withdrawal, uncomplicated: Secondary | ICD-10-CM | POA: Diagnosis present

## 2021-10-02 DIAGNOSIS — F419 Anxiety disorder, unspecified: Secondary | ICD-10-CM | POA: Diagnosis not present

## 2021-10-02 DIAGNOSIS — Z5941 Food insecurity: Secondary | ICD-10-CM | POA: Diagnosis not present

## 2021-10-02 DIAGNOSIS — Z79899 Other long term (current) drug therapy: Secondary | ICD-10-CM | POA: Diagnosis not present

## 2021-10-02 DIAGNOSIS — Z881 Allergy status to other antibiotic agents status: Secondary | ICD-10-CM | POA: Diagnosis not present

## 2021-10-02 DIAGNOSIS — D66 Hereditary factor VIII deficiency: Secondary | ICD-10-CM | POA: Diagnosis present

## 2021-10-02 DIAGNOSIS — Y900 Blood alcohol level of less than 20 mg/100 ml: Secondary | ICD-10-CM | POA: Diagnosis present

## 2021-10-02 DIAGNOSIS — E860 Dehydration: Secondary | ICD-10-CM | POA: Diagnosis present

## 2021-10-02 DIAGNOSIS — F32A Depression, unspecified: Secondary | ICD-10-CM | POA: Diagnosis present

## 2021-10-02 DIAGNOSIS — I10 Essential (primary) hypertension: Secondary | ICD-10-CM | POA: Diagnosis present

## 2021-10-02 DIAGNOSIS — G9341 Metabolic encephalopathy: Secondary | ICD-10-CM | POA: Diagnosis present

## 2021-10-02 LAB — BASIC METABOLIC PANEL
Anion gap: 9 (ref 5–15)
BUN: 5 mg/dL — ABNORMAL LOW (ref 6–20)
CO2: 24 mmol/L (ref 22–32)
Calcium: 9.4 mg/dL (ref 8.9–10.3)
Chloride: 103 mmol/L (ref 98–111)
Creatinine, Ser: 0.67 mg/dL (ref 0.44–1.00)
GFR, Estimated: >60 mL/min (ref >60)
Glucose, Bld: 122 mg/dL — ABNORMAL HIGH (ref 70–99)
Potassium: 3.6 mmol/L (ref 3.5–5.1)
Sodium: 136 mmol/L (ref 135–145)

## 2021-10-02 LAB — MAGNESIUM: Magnesium: 2 mg/dL (ref 1.7–2.4)

## 2021-10-02 MED ORDER — LORAZEPAM 2 MG/ML IJ SOLN: 1.0000 mg | INTRAMUSCULAR | Status: DC | PRN

## 2021-10-02 MED ORDER — LORAZEPAM 1 MG PO TABS: 1.0000 mg | ORAL_TABLET | ORAL | Status: DC | PRN

## 2021-10-02 MED ORDER — POTASSIUM CHLORIDE CRYS ER 10 MEQ PO TBCR
40.0000 meq | EXTENDED_RELEASE_TABLET | Freq: Once | ORAL | Status: AC
  Administered 2021-10-02: 40 meq via ORAL
  Filled 2021-10-02: qty 4

## 2021-10-02 NOTE — Progress Notes (Signed)
Initial Nutrition Assessment  DOCUMENTATION CODES:   Not applicable  INTERVENTION:  Encourage adequate PO intake Chopped meats with all meals for easy of chewing  Ensure Enlive po BID, each supplement provides 350 kcal and 20 grams of protein. MVI with minerals daily Recommend SLP evaluation for pt report of swallowing difficulties; MD to consult  NUTRITION DIAGNOSIS:   Inadequate oral intake related to social / environmental circumstances (alcohol use) as evidenced by per patient/family report.  GOAL:   Patient will meet greater than or equal to 90% of their needs  MONITOR:   PO intake, Supplement acceptance, Labs, Weight trends  REASON FOR ASSESSMENT:   Malnutrition Screening Tool    ASSESSMENT:   Pt admitted with acute metabolic encephalopathy. PMH significant for ADHD, ongoing alcohol use, depression and GERD.  Pt sitting in bed during visit. Observed lunch salad on table during assessment. She states that she was tremulous and unable to hold her fork just prior to visit, however was receiving treatment for this and planned to finish her salad once her tremors subsided. During admission, she reports having much improved intake and is drinking Ensure twice daily. She reports that for 1 week PTA, she was only eating popsicles as she was trying to "detox herself." She states that prior to the last week she had very minimal intake as she has been under stress. She recalls alcohol intake of 2 small boxes of pinot grigio however uncertain if this was daily/weekly/monthly.   No documented meal completions to review.   She states that for the past few months she has had difficulty with food feeling as though it is getting stuck at the bottom of her throat. She also endorses having difficulty chewing as well.   Unfortunately, there is limited documentation of wt history within the last year to review. Pt reports having wt gain d/t alcohol intake. Her documented wt on 06/10 was 49.4  kg and current admit wt is 64 kg. Will continue to monitor throughout admission.   Medications: MVI, protonix, thiamine, Vitamin B12  Labs: BUN 5   NUTRITION - FOCUSED PHYSICAL EXAM:  Flowsheet Row Most Recent Value  Orbital Region No depletion  Upper Arm Region No depletion  Thoracic and Lumbar Region No depletion  Buccal Region No depletion  Temple Region Mild depletion  Clavicle Bone Region No depletion  Clavicle and Acromion Bone Region No depletion  Scapular Bone Region No depletion  Dorsal Hand No depletion  Patellar Region Mild depletion  Anterior Thigh Region Mild depletion  Posterior Calf Region Mild depletion  Edema (RD Assessment) None  Hair Reviewed  Eyes Reviewed  Mouth Reviewed  Skin Reviewed  Nails Reviewed       Diet Order:   Diet Order             Diet regular Room service appropriate? Yes; Fluid consistency: Thin  Diet effective now                   EDUCATION NEEDS:   Education needs have been addressed  Skin:  Skin Assessment: Reviewed RN Assessment  Last BM:  7/9  Height:   Ht Readings from Last 1 Encounters:  09/29/21 5\' 1"  (1.549 m)    Weight:   Wt Readings from Last 1 Encounters:  10/01/21 64 kg   BMI:  Body mass index is 26.64 kg/m.  Estimated Nutritional Needs:   Kcal:  1600-1800  Protein:  80-95g  Fluid:  >/=1.6L  12/02/21, RDN, LDN Clinical  Nutrition

## 2021-10-02 NOTE — TOC Progression Note (Signed)
Transition of Care [facility]) - Progression Note    Patient Details  Name: Terri Maxwell MRN: [id number] Date of Birth: 06-03-1965  [facility] CM/SW Contact  Kermit Balo, RN Phone Number: 10/02/2021, 10:36 AM  Clinical Narrative:    CM provided her with the outpatient counseling centers that accept insurance and if they have female counselors. She will call after discussing with her daughter.  TOC following.   Expected Discharge Plan: Home/Self Care Barriers to Discharge: Continued Medical Work up  Expected Discharge Plan and Services Expected Discharge Plan: Home/Self Care   Discharge Planning Services: CM Consult   Living arrangements for the past 2 months: Apartment                                       Social Determinants of Health (SDOH) Interventions    Readmission Risk Interventions     No data to display

## 2021-10-02 NOTE — Progress Notes (Signed)
PROGRESS NOTE    Terri Maxwell  JJH:[id number] DOB: 1965-07-16 DOA: 09/29/2021 PCP: System, Provider Not In    Chief Complaint  Patient presents with   Altered Mental Status    Brief Narrative:  Patient 56 year old female history of ADHD, alcohol abuse ongoing, depression, GERD presented to the ED with confusion, disorientation and altered mental status.  Patient noted to have recent hospitalization for similar symptoms.  Patient admitted and being treated for dehydration, possible alcohol withdrawal, possible polypharmacy.   Assessment & Plan:   Principal Problem:   Acute metabolic encephalopathy Active Problems:   Alcohol abuse   Depression   Hypokalemia   Hypercalcemia   HTN (hypertension)   AMS (altered mental status)   GERD (gastroesophageal reflux disease)   Hypomagnesemia  #1 acute metabolic encephalopathy -Patient presented with confusion, disorientation, noted to be rambling. -Differential diagnosis likely multifactorial secondary to dehydration, possibility of alcohol withdrawal, probable polypharmacy/increased sleeping medications as per daughter patient has a history of taking lots of sleeping pills. -Patient received 2 L bolus of IV fluids in the ED placed on normal saline. -At time of admission patient with clinical improvement after fluid resuscitation.   -Chest x-ray with no acute abnormalities. -Urinalysis nitrite negative, leukocytes negative. -Salicylate level negative, alcohol level negative, Tylenol level negative. -UDS positive for benzos, THC. -Saline lock IV fluids.   -Continue Librium detox protocol.  -Patient noted to require increased amounts of Librium and as such we will place on IV Ativan in addition to Librium protocol. -Continue to hold Ambien and sedative medications.   -Likely will not resume Ambien on discharge.   -Supportive care.    2.  Dehydration -Saline lock IV fluids.   3.  Hypercalcemia -Likely secondary to  dehydration. -Repeat labs after fluid resuscitation with improvement with hypercalcemia. -Follow.  4.  GERD -PPI.   5.  Hypokalemia/hypomagnesemia -Potassium at 3.6 this morning, magnesium of 2.0. - Kdur 40 mEq p.o. x1. -Repeat labs in the AM.  6.  History of alcohol abuse -Patient denies any recent history of alcohol use stating last alcohol use was 2 weeks prior to admission however daughter does state patient was drinking 2 days prior to admission. -Patient with improving tremors.   -Patient noted overnight to be requiring extra doses of Librium and also noted to have a CIWA score today of 10. -Continue Librium detox protocol, thiamine, multivitamin, folic acid.   -We will also place on IV Ativan as needed. -TOC consultation placed.    7. ?  Hypertension -Patient not on any antihypertensive medications prior to admission.   -Blood pressure improved on current regimen of Norvasc 5 mg daily. -Will need outpatient follow-up    DVT prophylaxis: Lovenox Code Status: Full Family Communication: Updated patient.  No family at bedside. Disposition: TBD  Status is: Inpatient    Consultants:  None  Procedures:  CT head 09/29/2021 Chest x-ray 09/29/2021    Antimicrobials:  None   Subjective: Laying in bed.  Stated had a bad night last night with some anxiety.  No chest pain.  No shortness of breath.  Oral intake improving per patient.  Overall feeling better than she did on admission.  Noted to have a CIWA score of 10 today.  Objective: Vitals:   10/01/21 2325 10/02/21 0617 10/02/21 0744 10/02/21 1105  BP: 115/68 (!) 146/91 126/73 (!) 142/91  Pulse: 64 68 (!) 57 72  Resp: 16 16 19 19   Temp: 97.7 F (36.5 C) 97.6 F (36.4 C) 98 F (36.7  C) 97.6 F (36.4 C)  TempSrc: Oral Oral Oral Oral  SpO2: 96% 99% 97% 100%  Weight:      Height:        Intake/Output Summary (Last 24 hours) at 10/02/2021 1326 Last data filed at 10/01/2021 2330 Gross per 24 hour  Intake 960 ml   Output --  Net 960 ml    Filed Weights   09/29/21 2008 09/30/21 0505 10/01/21 0607  Weight: 67.3 kg 69.5 kg 64 kg    Examination:  General exam: NAD.  Tremors improving. Respiratory system: Clear to auscultation bilaterally.  No wheezes, no crackles, no rhonchi.  Fair air movement.  Speaking in full sentences.   Cardiovascular system: RRR no murmurs rubs or gallops.  No JVD.  No lower extremity edema.    Gastrointestinal system: Abdomen is soft, nontender, nondistended, positive bowel sounds.  No rebound.  No guarding.  Central nervous system: Alert and oriented. No focal neurological deficits. Extremities: Symmetric 5 x 5 power. Skin: No rashes, lesions or ulcers Psychiatry: Judgement and insight appear normal. Mood & affect appropriate.     Data Reviewed: I have personally reviewed following labs and imaging studies  CBC: Recent Labs  Lab 09/29/21 1225 09/30/21 0215 10/01/21 0116  WBC 12.4* 5.5 3.9*  NEUTROABS 9.3*  --   --   HGB 15.9* 12.8 13.7  HCT 46.2* 37.5 38.5  MCV 103.6* 103.6* 100.5*  PLT 246 154 128*     Basic Metabolic Panel: Recent Labs  Lab 09/29/21 1225 09/29/21 1649 09/30/21 0215 10/01/21 0116 10/02/21 0237  NA 138 141 140 136 136  K 4.0 3.1* 3.3* 3.4* 3.6  CL 100 108 110 104 103  CO2 24 21* 22 20* 24  GLUCOSE 122* 96 114* 137* 122*  BUN 12 11 7  <5* 5*  CREATININE 1.09* 0.85 0.75 0.57 0.67  CALCIUM 11.0* 8.5* 8.6* 8.5* 9.4  MG  --  1.6* 1.6* 2.1 2.0  PHOS  --  3.5  --   --   --      GFR: Estimated Creatinine Clearance: 69.7 mL/min (by C-G formula based on SCr of 0.67 mg/dL).  Liver Function Tests: Recent Labs  Lab 09/29/21 1225 09/30/21 0215  AST 42* 30  ALT 32 21  ALKPHOS 80 54  BILITOT 1.4* 0.8  PROT 8.3* 5.6*  ALBUMIN 5.2* 3.4*     CBG: Recent Labs  Lab 09/29/21 1728 09/30/21 0849 10/01/21 0814  GLUCAP 98 121* 110*      Recent Results (from the past 240 hour(s))  Urine Culture     Status: Abnormal    Collection Time: 09/29/21  4:39 PM   Specimen: Urine, Catheterized  Result Value Ref Range Status   Specimen Description URINE, CATHETERIZED  Final   Special Requests NONE  Final   Culture (A)  Final    <10,000 COLONIES/mL INSIGNIFICANT GROWTH Performed at [facility], [address], [place], [place]    Report Status 09/30/2021 FINAL  Final  SARS Coronavirus 2 by RT PCR (hospital order, performed in [facility] hospital lab) *cepheid single result test* Anterior Nasal Swab     Status: None   Collection Time: 09/29/21  7:20 PM   Specimen: Anterior Nasal Swab  Result Value Ref Range Status   SARS Coronavirus 2 by RT PCR NEGATIVE NEGATIVE Final    Comment: (NOTE) SARS-CoV-2 target nucleic acids are NOT DETECTED.  The SARS-CoV-2 RNA is generally detectable in upper and lower respiratory specimens during the  acute phase of infection. The lowest concentration of SARS-CoV-2 viral copies this assay can detect is 250 copies / mL. A negative result does not preclude SARS-CoV-2 infection and should not be used as the sole basis for treatment or other patient management decisions.  A negative result may occur with improper specimen collection / handling, submission of specimen other than nasopharyngeal swab, presence of viral mutation(s) within the areas targeted by this assay, and inadequate number of viral copies (<250 copies / mL). A negative result must be combined with clinical observations, patient history, and epidemiological information.  Fact Sheet for Patients:   RoadLapTop.co.za  Fact Sheet for Healthcare Providers: http://kim-miller.com/  This test is not yet approved or  cleared by the [place] FDA and has been authorized for detection and/or diagnosis of SARS-CoV-2 by FDA under an Emergency Use Authorization (EUA).  This EUA will remain in effect (meaning this test can be used) for the duration of  the COVID-19 declaration under Section 564(b)(1) of the Act, 21 U.S.C. section 360bbb-3(b)(1), unless the authorization is terminated or revoked sooner.  Performed at [facility], [address], [place], [place] [address]          Radiology Studies: No results found.      Scheduled Meds:  amLODipine  5 mg Oral Daily   chlordiazePOXIDE  25 mg Oral BH-qamhs   Followed by   Melene Muller ON 10/03/2021] chlordiazePOXIDE  25 mg Oral Daily   enoxaparin (LOVENOX) injection  40 mg Subcutaneous Q24H   feeding supplement  237 mL Oral BID BM   fluticasone  2 spray Each Nare Daily   loratadine  10 mg Oral Daily   methylphenidate  54 mg Oral q morning   multivitamin with minerals  1 tablet Oral Daily   pantoprazole  40 mg Oral QAC breakfast   sodium chloride flush  3 mL Intravenous Q12H   thiamine  100 mg Oral Daily   cyanocobalamin  1,000 mcg Oral Daily   Continuous Infusions:     LOS: 0 days    Time spent: 35 minutes    Ramiro Harvest, MD [facility]   To contact the attending provider between 7A-7P or the covering provider during after hours 7P-7A, please log into the web site [email or web address] and access using universal [place] password for that web site. If you do not have the password, please call the hospital operator.  10/02/2021, 1:26 PM

## 2021-10-03 ENCOUNTER — Other Ambulatory Visit (HOSPITAL_COMMUNITY): Payer: Self-pay

## 2021-10-03 DIAGNOSIS — F32A Depression, unspecified: Secondary | ICD-10-CM | POA: Diagnosis not present

## 2021-10-03 DIAGNOSIS — G9341 Metabolic encephalopathy: Secondary | ICD-10-CM | POA: Diagnosis not present

## 2021-10-03 DIAGNOSIS — F1010 Alcohol abuse, uncomplicated: Secondary | ICD-10-CM | POA: Diagnosis not present

## 2021-10-03 DIAGNOSIS — K219 Gastro-esophageal reflux disease without esophagitis: Secondary | ICD-10-CM | POA: Diagnosis not present

## 2021-10-03 LAB — CBC
HCT: 40.1 % (ref 36.0–46.0)
Hemoglobin: 14 g/dL (ref 12.0–15.0)
MCH: 36 pg — ABNORMAL HIGH (ref 26.0–34.0)
MCHC: 34.9 g/dL (ref 30.0–36.0)
MCV: 103.1 fL — ABNORMAL HIGH (ref 80.0–100.0)
Platelets: 165 10*3/uL (ref 150–400)
RBC: 3.89 MIL/uL (ref 3.87–5.11)
RDW: 12.2 % (ref 11.5–15.5)
WBC: 7.5 10*3/uL (ref 4.0–10.5)
nRBC: 0 % (ref 0.0–0.2)

## 2021-10-03 LAB — BASIC METABOLIC PANEL
Anion gap: 9 (ref 5–15)
BUN: 11 mg/dL (ref 6–20)
CO2: 22 mmol/L (ref 22–32)
Calcium: 9.4 mg/dL (ref 8.9–10.3)
Chloride: 106 mmol/L (ref 98–111)
Creatinine, Ser: 0.68 mg/dL (ref 0.44–1.00)
GFR, Estimated: >60 mL/min (ref >60)
Glucose, Bld: 124 mg/dL — ABNORMAL HIGH (ref 70–99)
Potassium: 4.2 mmol/L (ref 3.5–5.1)
Sodium: 137 mmol/L (ref 135–145)

## 2021-10-03 LAB — MAGNESIUM: Magnesium: 2 mg/dL (ref 1.7–2.4)

## 2021-10-03 MED ORDER — PANTOPRAZOLE SODIUM 40 MG PO TBEC
40.0000 mg | DELAYED_RELEASE_TABLET | Freq: Every day | ORAL | 1 refills | Status: AC
  Filled 2021-10-03 – 2021-10-28 (×2): qty 30, 30d supply, fill #0

## 2021-10-03 MED ORDER — ACETAMINOPHEN 325 MG PO TABS: 650.0000 mg | ORAL_TABLET | Freq: Four times a day (QID) | ORAL | Status: AC | PRN

## 2021-10-03 MED ORDER — ADULT MULTIVITAMIN W/MINERALS CH: 1.0000 | ORAL_TABLET | Freq: Every day | ORAL | Status: AC

## 2021-10-03 MED ORDER — FLUTICASONE PROPIONATE 50 MCG/ACT NA SUSP
2.0000 | Freq: Every day | NASAL | 0 refills | Status: AC
  Filled 2021-10-03: qty 16, 30d supply, fill #0

## 2021-10-03 MED ORDER — AMLODIPINE BESYLATE 5 MG PO TABS
5.0000 mg | ORAL_TABLET | Freq: Every day | ORAL | 1 refills | Status: AC
  Filled 2021-10-03 – 2021-11-10 (×3): qty 30, 30d supply, fill #0

## 2021-10-03 MED ORDER — LORATADINE 10 MG PO TABS
10.0000 mg | ORAL_TABLET | Freq: Every day | ORAL | 0 refills | Status: AC
  Filled 2021-10-03: qty 30, 30d supply, fill #0

## 2021-10-03 MED ORDER — HYDROXYZINE HCL 25 MG PO TABS
25.0000 mg | ORAL_TABLET | Freq: Three times a day (TID) | ORAL | 0 refills | Status: AC | PRN
  Filled 2021-10-03: qty 15, 5d supply, fill #0

## 2021-10-03 NOTE — Evaluation (Signed)
Clinical/Bedside Swallow Evaluation Patient Details  Name: Terri Maxwell MRN: [id number] Date of Birth: Jan 11, 1966  Today's Date: 10/03/2021 Time: SLP Start Time (ACUTE ONLY): 0930 SLP Stop Time (ACUTE ONLY): 0940 SLP Time Calculation (min) (ACUTE ONLY): 10 min  Past Medical History:  Past Medical History:  Diagnosis Date   Depression    ETOH abuse    Hemophilia (HCC)    Past Surgical History: History reviewed. No pertinent surgical history. HPI:  Patient 56 year old female history of ADHD, alcohol abuse ongoing, depression, GERD presented to the ED with confusion, disorientation and altered mental status.  Patient noted to have recent hospitalization for similar symptoms.  Patient admitted and being treated for dehydration, possible alcohol withdrawal, possible polypharmacy.    Assessment / Plan / Recommendation  Clinical Impression  Pt demonstrates no signs of aspiration. She does report occasionally feeling a tickle or need to cough on the left side of her throat when drinking or eating. Pt does not have any further signs of dysphagia, though she does report struggling with reflux and night time reflux. SLP and pt reviewed strategies. No further interventions needed, will sign off. SLP Visit Diagnosis: Dysphagia, unspecified (R13.10)    Aspiration Risk  Mild aspiration risk    Diet Recommendation Regular;Thin liquid   Liquid Administration via: Cup;Straw Medication Administration: Whole meds with liquid Supervision: Patient able to self feed Compensations: Slow rate;Small sips/bites Postural Changes: Seated upright at 90 degrees    Other  Recommendations      Recommendations for follow up therapy are one component of a multi-disciplinary discharge planning process, led by the attending physician.  Recommendations may be updated based on patient status, additional functional criteria and insurance authorization.  Follow up Recommendations No SLP follow up       Assistance Recommended at Discharge    Functional Status Assessment    Frequency and Duration            Prognosis        Swallow Study   General HPI: Patient 56 year old female history of ADHD, alcohol abuse ongoing, depression, GERD presented to the ED with confusion, disorientation and altered mental status.  Patient noted to have recent hospitalization for similar symptoms.  Patient admitted and being treated for dehydration, possible alcohol withdrawal, possible polypharmacy. Type of Study: Bedside Swallow Evaluation Diet Prior to this Study: Regular;Thin liquids Temperature Spikes Noted: No Respiratory Status: Room air History of Recent Intubation: No Behavior/Cognition: Alert;Cooperative;Pleasant mood Self-Feeding Abilities: Able to feed self Patient Positioning: Upright in bed Baseline Vocal Quality: Normal Volitional Cough: Strong    Oral/Motor/Sensory Function Overall Oral Motor/Sensory Function: Within functional limits   Ice Chips     Thin Liquid Thin Liquid: Within functional limits Presentation: Straw    Nectar Thick Nectar Thick Liquid: Not tested   Honey Thick Honey Thick Liquid: Not tested   Puree Puree: Not tested   Solid     Solid: Not tested      Claudine Mouton 10/03/2021,1:54 PM

## 2021-10-03 NOTE — TOC Transition Note (Signed)
Transition of Care [facility]) - CM/SW Discharge Note   Patient Details  Name: Terri Maxwell MRN: [id number] Date of Birth: 1965-06-27  [facility] CM/SW Contact:  Kermit Balo, RN Phone Number: 10/03/2021, 4:57 PM   Clinical Narrative:    Pt discharging home with self care. Pt has made an appointment with the [facility] for outpatient alcohol counseling. Pt has transport home.    Final next level of care: Home/Self Care Barriers to Discharge: No Barriers Identified   Patient Goals and CMS Choice        Discharge Placement                       Discharge Plan and Services   Discharge Planning Services: CM Consult                                 Social Determinants of Health (SDOH) Interventions     Readmission Risk Interventions     No data to display

## 2021-10-03 NOTE — Discharge Summary (Signed)
Physician Discharge Summary  Terri Maxwell HBZ:[id number] DOB: 09-11-1965 DOA: 09/29/2021  PCP: System, Provider Not In  Admit date: 09/29/2021 Discharge date: 10/03/2021  Time spent: 55 minutes  Recommendations for Outpatient Follow-up:  Follow-up with PCP in 2 weeks.  On follow-up patient need a basic metabolic profile, magnesium level checked to follow-up on electrolytes and renal function.  Blood pressure needs to be reassessed on follow-up.   Discharge Diagnoses:  Principal Problem:   Acute metabolic encephalopathy Active Problems:   Alcohol abuse   Depression   Hypokalemia   Hypercalcemia   HTN (hypertension)   AMS (altered mental status)   GERD (gastroesophageal reflux disease)   Hypomagnesemia   Discharge Condition: Stable and improved.  Diet recommendation: Regular  Filed Weights   09/29/21 2008 09/30/21 0505 10/01/21 0607  Weight: 67.3 kg 69.5 kg 64 kg    History of present illness:   Terri Maxwell is a 56 y.o. female with medical history significant of ADHD, alcohol abuse ongoing, depression, GERD who presented to ED with confusion/disorientation and altered mental status.  Per ED physician's note EMS was called to patient's apartment complex as she was found wandering around.  Patient had told EMS she got locked out of her apartment.  Per family members patient does have a history of alcohol use and use of sleeping pills.  Patient told ED physician she was getting [place] go to the airport, she was out working outside and someone saw the patient and stated she did not look good enough.  Patient stated was noted to be confused and was out walking somebody told her she did not look good, was walking gingerly and patient taken to the office.  EMS was called and patient brought to the ED.  Patient denies any recent fever, no chills, no nausea, no vomiting, no chest pain, no shortness of breath, no abdominal pain, no hematemesis, hematochezia, no melena, no diarrhea, no  constipation, no dysuria, no increased urinary frequency.  Patient with some complaints of congestion, patient states she has had a poor appetite with decreased oral intake and has just been drinking water and eating popsicles.  When probed further patient states does not have a job and unable to afford groceries.  Patient stated 2 weeks ago had some visual hallucinations and has had none since then.  Patient denies any recent alcohol use however daughter at bedside stated patient was drinking 2 days prior to admission. Daughter at bedside stated on day of admission she was called by patient's apartment complex that patient was noted to be disoriented and confused.  She also noted that patient was rambling.  Patient brought to the ED per EMS   ED Course: Patient seen in the ED, initially noted to have a tachycardia, blood pressure initially 105/65 and subsequently up to 162/101.  Patient given a 2 L fluid bolus and placed on IV fluids.  Head CT done negative for any acute abnormalities initial comprehensive metabolic profile done with a calcium of 11, creatinine of 1.09, albumin of 5.2, AST of 42, protein of 8.3, bilirubin of 1.4 otherwise was within normal limits.  Lactic acid level noted at 1.8.  CBC done with a white count of 12.4, hemoglobin of 15.9 otherwise was within normal limits.  Acetaminophen level < 10, salicylate level < 7, alcohol level < 10.  Urinalysis pending. On my assessment in the ED patient had improved clinically alert and oriented x3.    Hospital Course:  #1 acute metabolic encephalopathy -Patient presented  with confusion, disorientation, noted to be rambling. -Differential diagnosis likely multifactorial secondary to dehydration, possibility of alcohol withdrawal, probable polypharmacy/increased sleeping medications as per daughter patient has a history of taking lots of sleeping pills. -Patient received 2 L bolus of IV fluids in the ED placed on normal saline. -At time of  admission patient with clinical improvement after fluid resuscitation.   -Chest x-ray with no acute abnormalities. -Urinalysis nitrite negative, leukocytes negative. -Salicylate level negative, alcohol level negative, Tylenol level negative. -UDS positive for benzos, THC. -Patient hydrated, placed on the Librium detox protocol as well as IV Ativan as needed.  -Patient noted to require increased amounts of Librium and as such placed on IV Ativan in addition to Librium protocol. -Ambien held as well as sedative medications.   -Acute metabolic encephalopathy had resolved by day of discharge.   2.  Dehydration -Patient hydrated with IV fluids.   3.  Hypercalcemia -Likely secondary to dehydration. -Repeat labs after fluid resuscitation with resolution of hypercalcemia.   -Outpatient follow-up.  4.  GERD -Patient maintained on PPI.   5.  Hypokalemia/hypomagnesemia -Repleted during the hospitalization.   -Potassium noted at 4.2, magnesium at 2.0 by day of discharge.  6.  History of alcohol abuse -Patient denied any recent history of alcohol use stating last alcohol use was 2 weeks prior to admission however daughter does state patient was drinking 2 days prior to admission. -Patient was placed on the Librium detox protocol as well as IV Ativan as needed with clinical improvement during the hospitalization.   -Patient completed Librium detox protocol.   -Patient given information on alcohol cessation and patient will follow-up in the outpatient setting.   -Patient was discharged in stable and improved condition.     7. ?  Hypertension -Patient not on any antihypertensive medications prior to admission.   -Blood pressure improved on Norvasc 5 mg daily. -Patient was discharged on Norvasc 5 mg daily. -Outpatient follow-up.    Procedures: CT head 09/29/2021 Chest x-ray 09/29/2021  Consultations: None  Discharge Exam: Vitals:   10/03/21 1227 10/03/21 1551  BP: 139/84 (!) 144/89   Pulse: 67 79  Resp: 20 14  Temp: 98.3 F (36.8 C) 97.8 F (36.6 C)  SpO2: 100% 99%    General: NAD Cardiovascular: Regular rate and rhythm no murmurs rubs or gallops.  No JVD.  No lower extremity edema. Respiratory: CTA B.  No wheezes, no crackles, no rhonchi.  Discharge Instructions   Discharge Instructions     Diet general   Complete by: As directed    Increase activity slowly   Complete by: As directed       Allergies as of 10/03/2021       Reactions   Codeine Shortness Of Breath   Tongue Swelling   Keflex [cephalexin]    Tongue Swelling   Latex Hives   Per pt report    Penicillins    Did it involve swelling of the face/tongue/throat, SOB, or low BP? Yes Did it involve sudden or severe rash/hives, skin peeling, or any reaction on the inside of your mouth or nose? Yes Did you need to seek medical attention at a hospital or doctor's office? Yes When did it last happen?   Over 20 Years Ago    If all above answers are "NO", may proceed with cephalosporin use.   Zolpidem Other (See Comments)   Patient stated medication gives her memory loss        Medication List  TAKE these medications    acetaminophen 325 MG tablet Commonly known as: TYLENOL Take 2 tablets (650 mg total) by mouth every 6 (six) hours as needed for mild pain (or Fever >/= 101).   amLODipine 5 MG tablet Commonly known as: NORVASC Take 1 tablet (5 mg total) by mouth daily. Start taking on: October 04, 2021   cyanocobalamin 1000 MCG tablet Take 1 tablet (1,000 mcg total) by mouth daily.   fluticasone 50 MCG/ACT nasal spray Commonly known as: FLONASE Place 2 sprays into both nostrils daily. Start taking on: July 12, 99991111   folic acid 1 MG tablet Commonly known as: FOLVITE Take 1 tablet (1 mg total) by mouth daily.   hydrOXYzine 25 MG tablet Commonly known as: ATARAX Take 1 tablet (25 mg total) by mouth 3 (three) times daily as needed for anxiety.   loratadine 10 MG  tablet Commonly known as: CLARITIN Take 1 tablet (10 mg total) by mouth daily. Start taking on: October 04, 2021   methylphenidate 54 MG CR tablet Commonly known as: CONCERTA Take 54 mg by mouth every morning.   multivitamin with minerals Tabs tablet Take 1 tablet by mouth daily. Start taking on: October 04, 2021   pantoprazole 40 MG tablet Commonly known as: PROTONIX Take 1 tablet (40 mg total) by mouth daily before breakfast.   thiamine 100 MG tablet Take 1 tablet (100 mg total) by mouth daily.       Allergies  Allergen Reactions   Codeine Shortness Of Breath    Tongue Swelling   Keflex [Cephalexin]     Tongue Swelling   Latex Hives    Per pt report    Penicillins     Did it involve swelling of the face/tongue/throat, SOB, or low BP? Yes Did it involve sudden or severe rash/hives, skin peeling, or any reaction on the inside of your mouth or nose? Yes Did you need to seek medical attention at a hospital or doctor's office? Yes When did it last happen?   Over 20 Years Ago    If all above answers are "NO", may proceed with cephalosporin use.     Zolpidem Other (See Comments)    Patient stated medication gives her memory loss    Follow-up Information     PCP. Schedule an appointment as soon as possible for a visit in 2 week(s).                   The results of significant diagnostics from this hospitalization (including imaging, microbiology, ancillary and laboratory) are listed below for reference.    Significant Diagnostic Studies: DG Chest 2 View  Result Date: 09/29/2021 CLINICAL DATA:  Confusion. EXAM: CHEST - 2 VIEW COMPARISON:  None Available. FINDINGS: The cardiomediastinal contours are normal. The lungs are clear. Pulmonary vasculature is normal. No consolidation, pleural effusion, or pneumothorax. No acute osseous abnormalities are seen. IMPRESSION: Negative radiographs of the chest. Electronically Signed   By: Keith Rake M.D.   On: 09/29/2021 19:46    CT Head Wo Contrast  Result Date: 09/29/2021 CLINICAL DATA:  Mental status change.  Unknown cause. EXAM: CT HEAD WITHOUT CONTRAST TECHNIQUE: Contiguous axial images were obtained from the base of the skull through the vertex without intravenous contrast. RADIATION DOSE REDUCTION: This exam was performed according to the departmental dose-optimization program which includes automated exposure control, adjustment of the mA and/or kV according to patient size and/or use of iterative reconstruction technique. COMPARISON:  CT head 01/30/2008. FINDINGS: Brain:  There is no evidence of acute intracranial hemorrhage, mass lesion, brain edema or extra-axial fluid collection. Progressive atrophy with prominence of the ventricles and subarachnoid spaces. Patchy low-density in the periventricular white matter most consistent with chronic small vessel ischemic changes. There is no CT evidence of acute cortical infarction. Vascular:  No hyperdense vessel identified. Skull: Negative for fracture or focal lesion. Sinuses/Orbits: The visualized paranasal sinuses and mastoid air cells are clear. No orbital abnormalities are seen. Other: None. IMPRESSION: No acute intracranial findings. Mildly progressive atrophy and chronic small vessel ischemic changes in the periventricular white matter. Electronically Signed   By: Carey Bullocks M.D.   On: 09/29/2021 14:38    Microbiology: Recent Results (from the past 240 hour(s))  Urine Culture     Status: Abnormal   Collection Time: 09/29/21  4:39 PM   Specimen: Urine, Catheterized  Result Value Ref Range Status   Specimen Description URINE, CATHETERIZED  Final   Special Requests NONE  Final   Culture (A)  Final    <10,000 COLONIES/mL INSIGNIFICANT GROWTH Performed at [facility], [address] [place], [place] [address]    Report Status 09/30/2021 FINAL  Final  SARS Coronavirus 2 by RT PCR (hospital order, performed in [facility] hospital lab) *cepheid single  result test* Anterior Nasal Swab     Status: None   Collection Time: 09/29/21  7:20 PM   Specimen: Anterior Nasal Swab  Result Value Ref Range Status   SARS Coronavirus 2 by RT PCR NEGATIVE NEGATIVE Final    Comment: (NOTE) SARS-CoV-2 target nucleic acids are NOT DETECTED.  The SARS-CoV-2 RNA is generally detectable in upper and lower respiratory specimens during the acute phase of infection. The lowest concentration of SARS-CoV-2 viral copies this assay can detect is 250 copies / mL. A negative result does not preclude SARS-CoV-2 infection and should not be used as the sole basis for treatment or other patient management decisions.  A negative result may occur with improper specimen collection / handling, submission of specimen other than nasopharyngeal swab, presence of viral mutation(s) within the areas targeted by this assay, and inadequate number of viral copies (<250 copies / mL). A negative result must be combined with clinical observations, patient history, and epidemiological information.  Fact Sheet for Patients:   RoadLapTop.co.za  Fact Sheet for Healthcare Providers: http://kim-miller.com/  This test is not yet approved or  cleared by the [place] FDA and has been authorized for detection and/or diagnosis of SARS-CoV-2 by FDA under an Emergency Use Authorization (EUA).  This EUA will remain in effect (meaning this test can be used) for the duration of the COVID-19 declaration under Section 564(b)(1) of the Act, 21 U.S.C. section 360bbb-3(b)(1), unless the authorization is terminated or revoked sooner.  Performed at [facility], [address] [place], [place] [address]      Labs: Basic Metabolic Panel: Recent Labs  Lab 09/29/21 1649 09/30/21 0215 10/01/21 0116 10/02/21 0237 10/03/21 0102  NA 141 140 136 136 137  K 3.1* 3.3* 3.4* 3.6 4.2  CL 108 110 104 103 106  CO2 21* 22 20* 24 22  GLUCOSE 96  114* 137* 122* 124*  BUN 11 7 <5* 5* 11  CREATININE 0.85 0.75 0.57 0.67 0.68  CALCIUM 8.5* 8.6* 8.5* 9.4 9.4  MG 1.6* 1.6* 2.1 2.0 2.0  PHOS 3.5  --   --   --   --    Liver Function Tests: Recent Labs  Lab 09/29/21  1225 09/30/21 0215  AST 42* 30  ALT 32 21  ALKPHOS 80 54  BILITOT 1.4* 0.8  PROT 8.3* 5.6*  ALBUMIN 5.2* 3.4*   No results for input(s): "LIPASE", "AMYLASE" in the last 168 hours. Recent Labs  Lab 09/29/21 1225  AMMONIA 25   CBC: Recent Labs  Lab 09/29/21 1225 09/30/21 0215 10/01/21 0116 10/03/21 0102  WBC 12.4* 5.5 3.9* 7.5  NEUTROABS 9.3*  --   --   --   HGB 15.9* 12.8 13.7 14.0  HCT 46.2* 37.5 38.5 40.1  MCV 103.6* 103.6* 100.5* 103.1*  PLT 246 154 128* 165   Cardiac Enzymes: No results for input(s): "CKTOTAL", "CKMB", "CKMBINDEX", "TROPONINI" in the last 168 hours. BNP: BNP (last 3 results) No results for input(s): "BNP" in the last 8760 hours.  ProBNP (last 3 results) No results for input(s): "PROBNP" in the last 8760 hours.  CBG: Recent Labs  Lab 09/29/21 1728 09/30/21 0849 10/01/21 0814  GLUCAP 98 121* 110*       Signed:  Irine Seal MD.  [facility] 10/03/2021, 3:59 PM

## 2021-10-26 ENCOUNTER — Other Ambulatory Visit (HOSPITAL_COMMUNITY): Payer: Self-pay

## 2021-11-03 ENCOUNTER — Other Ambulatory Visit (HOSPITAL_COMMUNITY): Payer: Self-pay

## 2021-11-09 ENCOUNTER — Other Ambulatory Visit (HOSPITAL_COMMUNITY): Payer: Self-pay

## 2021-12-01 ENCOUNTER — Other Ambulatory Visit (HOSPITAL_COMMUNITY): Payer: Self-pay

## 2021-12-07 ENCOUNTER — Other Ambulatory Visit (HOSPITAL_COMMUNITY): Payer: Self-pay

## 2021-12-08 ENCOUNTER — Other Ambulatory Visit (HOSPITAL_COMMUNITY): Payer: Self-pay

## 2021-12-09 ENCOUNTER — Other Ambulatory Visit (HOSPITAL_COMMUNITY): Payer: Self-pay

## 2021-12-18 ENCOUNTER — Other Ambulatory Visit (HOSPITAL_COMMUNITY): Payer: Self-pay

## 2021-12-31 ENCOUNTER — Other Ambulatory Visit (HOSPITAL_COMMUNITY): Payer: Self-pay

## 2022-02-12 ENCOUNTER — Other Ambulatory Visit (HOSPITAL_COMMUNITY): Payer: Self-pay
